# Patient Record
Sex: Male | Born: 1960 | Race: White | Hispanic: No | Marital: Single | Smoking: Never smoker
Health system: Southern US, Community
[De-identification: ages and names within clinical notes are randomized; demographics above are authoritative.]

## PROBLEM LIST (undated history)

## (undated) DIAGNOSIS — K5732 Diverticulitis of large intestine without perforation or abscess without bleeding: Secondary | ICD-10-CM

## (undated) DIAGNOSIS — F329 Major depressive disorder, single episode, unspecified: Secondary | ICD-10-CM

## (undated) DIAGNOSIS — E119 Type 2 diabetes mellitus without complications: Secondary | ICD-10-CM

## (undated) DIAGNOSIS — Z6841 Body Mass Index (BMI) 40.0 and over, adult: Secondary | ICD-10-CM

## (undated) DIAGNOSIS — J449 Chronic obstructive pulmonary disease, unspecified: Secondary | ICD-10-CM

## (undated) DIAGNOSIS — F32A Depression, unspecified: Secondary | ICD-10-CM

## (undated) DIAGNOSIS — Z5189 Encounter for other specified aftercare: Secondary | ICD-10-CM

## (undated) DIAGNOSIS — I639 Cerebral infarction, unspecified: Secondary | ICD-10-CM

## (undated) DIAGNOSIS — R569 Unspecified convulsions: Secondary | ICD-10-CM

## (undated) DIAGNOSIS — H269 Unspecified cataract: Secondary | ICD-10-CM

## (undated) DIAGNOSIS — I10 Essential (primary) hypertension: Secondary | ICD-10-CM

## (undated) DIAGNOSIS — K5190 Ulcerative colitis, unspecified, without complications: Secondary | ICD-10-CM

## (undated) DIAGNOSIS — C187 Malignant neoplasm of sigmoid colon: Secondary | ICD-10-CM

## (undated) DIAGNOSIS — F419 Anxiety disorder, unspecified: Secondary | ICD-10-CM

## (undated) DIAGNOSIS — L0293 Carbuncle, unspecified: Secondary | ICD-10-CM

## (undated) DIAGNOSIS — L0292 Furuncle, unspecified: Secondary | ICD-10-CM

## (undated) DIAGNOSIS — C787 Secondary malignant neoplasm of liver and intrahepatic bile duct: Secondary | ICD-10-CM

## (undated) DIAGNOSIS — I2510 Atherosclerotic heart disease of native coronary artery without angina pectoris: Secondary | ICD-10-CM

## (undated) DIAGNOSIS — K7581 Nonalcoholic steatohepatitis (NASH): Secondary | ICD-10-CM

## (undated) HISTORY — PX: CARDIAC CATHETERIZATION: SHX172

## (undated) HISTORY — DX: Anxiety disorder, unspecified: F41.9

## (undated) HISTORY — DX: Encounter for other specified aftercare: Z51.89

## (undated) HISTORY — DX: Carbuncle, unspecified: L02.93

## (undated) HISTORY — DX: Type 2 diabetes mellitus without complications: E11.9

## (undated) HISTORY — PX: ABDOMINAL SURGERY: SHX537

## (undated) HISTORY — DX: Furuncle, unspecified: L02.92

## (undated) HISTORY — DX: Body Mass Index (BMI) 40.0 and over, adult: Z684

## (undated) HISTORY — DX: Major depressive disorder, single episode, unspecified: F32.9

## (undated) HISTORY — PX: COLONOSCOPY: SHX174

## (undated) HISTORY — DX: Secondary malignant neoplasm of liver and intrahepatic bile duct: C78.7

## (undated) HISTORY — DX: Cerebral infarction, unspecified: I63.9

## (undated) HISTORY — DX: Chronic obstructive pulmonary disease, unspecified: J44.9

## (undated) HISTORY — DX: Unspecified cataract: H26.9

## (undated) HISTORY — DX: Malignant neoplasm of sigmoid colon: C18.7

## (undated) HISTORY — DX: Ulcerative colitis, unspecified, without complications: K51.90

## (undated) HISTORY — PX: POLYPECTOMY: SHX149

## (undated) HISTORY — PX: OTHER SURGICAL HISTORY: SHX169

## (undated) HISTORY — DX: Atherosclerotic heart disease of native coronary artery without angina pectoris: I25.10

## (undated) HISTORY — PX: TONSILLECTOMY: SUR1361

## (undated) HISTORY — DX: Unspecified convulsions: R56.9

## (undated) HISTORY — DX: Diverticulitis of large intestine without perforation or abscess without bleeding: K57.32

## (undated) HISTORY — DX: Essential (primary) hypertension: I10

## (undated) HISTORY — DX: Morbid (severe) obesity due to excess calories: E66.01

## (undated) HISTORY — DX: Nonalcoholic steatohepatitis (NASH): K75.81

## (undated) HISTORY — DX: Depression, unspecified: F32.A

---

## 2011-03-24 DIAGNOSIS — C187 Malignant neoplasm of sigmoid colon: Secondary | ICD-10-CM

## 2011-03-24 HISTORY — DX: Malignant neoplasm of sigmoid colon: C18.7

## 2011-04-23 HISTORY — PX: COLON SURGERY: SHX602

## 2011-07-23 ENCOUNTER — Emergency Department (HOSPITAL_COMMUNITY)
Admission: EM | Admit: 2011-07-23 | Discharge: 2011-07-24 | Disposition: A | Discharge status: Home / Self Care | Payer: Self-pay | Attending: Emergency Medicine | Admitting: Emergency Medicine

## 2011-07-23 ENCOUNTER — Emergency Department (HOSPITAL_COMMUNITY): Payer: Self-pay

## 2011-07-23 ENCOUNTER — Encounter (HOSPITAL_COMMUNITY): Payer: Self-pay | Admitting: *Deleted

## 2011-07-23 DIAGNOSIS — Z79899 Other long term (current) drug therapy: Secondary | ICD-10-CM | POA: Insufficient documentation

## 2011-07-23 DIAGNOSIS — R110 Nausea: Secondary | ICD-10-CM | POA: Insufficient documentation

## 2011-07-23 DIAGNOSIS — R42 Dizziness and giddiness: Secondary | ICD-10-CM | POA: Insufficient documentation

## 2011-07-23 DIAGNOSIS — R1033 Periumbilical pain: Secondary | ICD-10-CM | POA: Insufficient documentation

## 2011-07-23 DIAGNOSIS — C189 Malignant neoplasm of colon, unspecified: Secondary | ICD-10-CM | POA: Insufficient documentation

## 2011-07-23 DIAGNOSIS — R51 Headache: Secondary | ICD-10-CM | POA: Insufficient documentation

## 2011-07-23 DIAGNOSIS — E119 Type 2 diabetes mellitus without complications: Secondary | ICD-10-CM | POA: Insufficient documentation

## 2011-07-23 LAB — BASIC METABOLIC PANEL
GFR calc Af Amer: 82 mL/min — ABNORMAL LOW (ref >90)
GFR calc non Af Amer: 71 mL/min — ABNORMAL LOW (ref >90)
Glucose, Bld: 161 mg/dL — ABNORMAL HIGH (ref 70–99)
Potassium: 4.4 mEq/L (ref 3.5–5.1)
Sodium: 136 mEq/L (ref 135–145)

## 2011-07-23 LAB — URINALYSIS, ROUTINE W REFLEX MICROSCOPIC
Hgb urine dipstick: NEGATIVE
Nitrite: NEGATIVE
Specific Gravity, Urine: 1.023 (ref 1.005–1.030)
Urobilinogen, UA: 0.2 mg/dL (ref 0.0–1.0)

## 2011-07-23 LAB — CBC
Hemoglobin: 12.2 g/dL — ABNORMAL LOW (ref 13.0–17.0)
MCHC: 33 g/dL (ref 30.0–36.0)

## 2011-07-23 NOTE — ED Provider Notes (Signed)
History     CSN: [id number]  Arrival date & time 07/23/11  1713   First MD Initiated Contact with Patient 07/23/11 1929      Chief Complaint  Patient presents with  . Post-op Problem    (Consider location/radiation/quality/duration/timing/severity/associated sxs/prior treatment) Patient is a 51 y.o. male presenting with abdominal pain. The history is provided by the patient.  Abdominal Pain The primary symptoms of the illness include abdominal pain and nausea. The primary symptoms of the illness do not include shortness of breath, vomiting or diarrhea. Episode onset: 1.5 months ago. The onset of the illness was gradual. The problem has been gradually worsening.  Pain Location: around the umbilicus and describes it as a burning pain. The abdominal pain does not radiate. The severity of the abdominal pain is 6/10. The abdominal pain is relieved by nothing. Exacerbated by: nothing.  The patient has had a change in bowel habit (watery stools for the last 1 week). Symptoms associated with the illness do not include chills, anorexia, diaphoresis, urgency, frequency or back pain. Associated medical issues comments: recent abdominal surgery.    Past Medical History  Diagnosis Date  . Cancer   . Diabetes mellitus     Past Surgical History  Procedure Date  . Abdominal surgery   . Colon surgery     No family history on file.  History  Substance Use Topics  . Smoking status: Not on file  . Smokeless tobacco: Not on file  . Alcohol Use: No      Review of Systems  Constitutional: Negative for chills and diaphoresis.  Respiratory: Negative for shortness of breath.   Gastrointestinal: Positive for nausea and abdominal pain. Negative for vomiting, diarrhea and anorexia.  Genitourinary: Negative for urgency and frequency.  Musculoskeletal: Negative for back pain.  Neurological: Positive for dizziness. Negative for syncope and speech difficulty.       Patient states for the last 2  months he has had dizziness that he describes as spinning. It was worse when he moves his head or is walking.  He states intermittently he also will get tingling in his left arm and leg it is not associated with headaches or abdominal pain.  All other systems reviewed and are negative.    Allergies  Review of patient's allergies indicates no known allergies.  Home Medications   Current Outpatient Rx  Name Route Sig Dispense Refill  . CETIRIZINE HCL 10 MG PO TABS Oral Take 10 mg by mouth daily.    . DESVENLAFAXINE SUCCINATE ER 50 MG PO TB24 Oral Take 50 mg by mouth daily.    Marland Kitchen DICLOFENAC SODIUM 50 MG PO TBEC Oral Take 50 mg by mouth 2 (two) times daily as needed. For pain    . DIPHENHYDRAMINE HCL (SLEEP) 25 MG PO TABS Oral Take 25 mg by mouth at bedtime as needed. For sleep or itching    . GLIPIZIDE 10 MG PO TABS Oral Take 10 mg by mouth 2 (two) times daily before a meal.    . IBUPROFEN 200 MG PO TABS Oral Take 600 mg by mouth every 6 (six) hours as needed. For pain    . MECLIZINE HCL 25 MG PO TABS Oral Take 25 mg by mouth 2 (two) times daily as needed. For dizziness    . METFORMIN HCL 500 MG PO TABS Oral Take 500 mg by mouth 2 (two) times daily with a meal. 500 mg in the am and 1000mg  at bedtime    . ZOFRAN  PO Oral Take 50 mg by mouth 2 (two) times daily as needed. For nausea    . TRAZODONE HCL 100 MG PO TABS Oral Take 200 mg by mouth at bedtime.      BP 141/74  Pulse 82  Temp(Src) 97.7 F (36.5 C) (Oral)  Resp 20  SpO2 100%  Physical Exam  Nursing note and vitals reviewed. Constitutional: He is oriented to person, place, and time. He appears well-developed and well-nourished. No distress.  HENT:  Head: Normocephalic and atraumatic.  Mouth/Throat: Oropharynx is clear and moist.  Eyes: Conjunctivae and EOM are normal. Pupils are equal, round, and reactive to light.  Neck: Normal range of motion. Neck supple.  Cardiovascular: Normal rate, regular rhythm and intact distal pulses.    No murmur heard. Pulmonary/Chest: Effort normal and breath sounds normal. No respiratory distress. He has no wheezes. He has no rales.  Abdominal: Soft. Normal appearance and bowel sounds are normal. He exhibits no distension. There is generalized tenderness. There is no rebound and no guarding. No hernia.       Healed midline abdominal scar. Mild tenderness diffusely in the abdomen  Musculoskeletal: Normal range of motion. He exhibits no edema and no tenderness.  Neurological: He is alert and oriented to person, place, and time. He has normal strength. No cranial nerve deficit or sensory deficit. Coordination normal.  Skin: Skin is warm and dry. No rash noted. No erythema.  Psychiatric: He has a normal mood and affect. His behavior is normal.    ED Course  Procedures (including critical care time)  Labs Reviewed  CBC - Abnormal; Notable for the following:    Hemoglobin 12.2 (*)    HCT 37.0 (*)    All other components within normal limits  BASIC METABOLIC PANEL - Abnormal; Notable for the following:    Glucose, Bld 161 (*)    BUN 26 (*)    GFR calc non Af Amer 71 (*)    GFR calc Af Amer 82 (*)    All other components within normal limits  URINALYSIS, ROUTINE W REFLEX MICROSCOPIC   Dg Abd 1 View  07/23/2011  *RADIOLOGY REPORT*  Clinical Data: Abdominal pain.  ABDOMEN - 1 VIEW  Comparison: CT scan 07/15/2011.  Findings: The bowel gas pattern is unremarkable.  No findings for small bowel obstruction.  The soft tissue shadows of the abdomen maintained.  The lung bases are not included but no obvious free air.  The bony structures are intact.  Moderate degenerative changes involving both hips.  IMPRESSION:  No plain film findings for an acute abdominal process.  Original Report Authenticated By: P. Loralie Champagne, M.D.   Ct Head Wo Contrast  07/23/2011  *RADIOLOGY REPORT*  Clinical Data: Headache, dizziness, history of carcinoma of the colon  CT HEAD WITHOUT CONTRAST  Technique:  Contiguous  axial images were obtained from the base of the skull through the vertex without contrast.  Comparison: 11/11/2010 Correlation:  MRI brain 07/01/2011  Findings: Extensive falcine dural calcification again identified. Generalized atrophy. Normal ventricular morphology. No midline shift or mass effect. No definite intracranial hemorrhage, mass lesion, or evidence of acute infarction. No definite extra-axial fluid collections. 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 Visualized bones and sinuses unremarkable.  IMPRESSION: No acute intracranial abnormalities.  Original Report Authenticated By: Lollie Marrow, M.D.     No diagnosis found.    MDM   Patient here with 2  separate issues. His initial concern was for persistent abdominal pain after colon surgery on 04/23/2011 colon cancer. He had a primary anastomosis of the sigmoid colon and states over last month and a half he's had worsening burning pain around his umbilicus change in his stool this last one week. He had a CT scan done on 07/15/2011 which showed adjacent area of infiltration, scarring and focal gas collection next to the surgical anastomosis. Per the radiologist this is slightly improved from previous study done in March. They state the differential diagnosis includes an anastomotic leak, postoperative changes or infection. Patient denies any fever, vomiting but has been nauseated. CBC here shows no normal white blood cell count and BMP is within normal limits. Will speak with general surgery to review the films and give further recommendations.  Secondly patient states he's been dizzy for 2 months. The dizziness he describes as more of vertigo type feeling that has been constant for 2 months but is worse when he gets up and walks around or moves his head too quickly in one direction. He also states that he gets intermittent left-sided tingling in his arm and leg but  denies ever having any weakness or numbness. Patient is diabetic but is controlled on oral glipizide and metformin. He is already taking Antivert for his dizziness.  She is no prior stroke history and he has a normal neuro exam here. He has no difficulty with his gait. Head CT pending however patient had a brain MR done on 07/01/2011 because of these exact same symptoms and it was negative for any infarct and showed a persistent meningioma which is unchanged.  Given these findings most likely the patient has peripheral vertigo.  9:53 PM  Spoke with Dr. Michaell Cowing about pt and CT findings.  Because the scan is one week old and patient is having persistent abdominal pain and watery stools now he recommended doing a repeat CT scan. However due to patient having normal labs, normal vital signs and in no extremities.. The CAT scan is unchanged it would be most reasonable for patient to followup in the clinic when all of the details of his surgery and prior hospitalizations are available.   Pt checked out to Dr. Fonnie Jarvis at [address], MD 07/25/11 [phone]

## 2011-07-23 NOTE — ED Notes (Signed)
Pt had a colon surgery 04/23/11 and has been having generalized weakness and intermittent dizziness.   He had a follow up CT which showed a "leak"  Pt spoke with surgeon who was not concerned, pt however is and is here for evaluation.

## 2011-07-23 NOTE — ED Notes (Signed)
CT notified that pt has finished drinking.  Will scan at 23:30.

## 2011-07-23 NOTE — ED Notes (Signed)
Pt received po contrast.  One to drink at 9:30, 2nd at 10:30.

## 2011-07-23 NOTE — ED Notes (Signed)
Pt had colon resection in January 2013.  Had f/u CT this week and MD called to tell him there was a small leakage but it was not a problem.  Wants to be seen tonight about dizziness and numbness/tingling in left arm and left leg for 2-3 months.  Pt states episodes of dizziness, no recent falls but states he felt dizzy tonight and had to brace himself against the wall.  Ambulatory to exam rooms with no apparent problems.  Alert and oriented x 4, MAE

## 2011-07-23 NOTE — ED Notes (Signed)
Pt returned from xray

## 2011-07-23 NOTE — ED Notes (Signed)
Pt ambulatory from triage to exam room.  

## 2011-07-24 LAB — GLUCOSE, CAPILLARY: Glucose-Capillary: 108 mg/dL — ABNORMAL HIGH (ref 70–99)

## 2011-07-24 MED ORDER — IOHEXOL 300 MG/ML  SOLN
100.0000 mL | Freq: Once | INTRAMUSCULAR | Status: AC | PRN
  Administered 2011-07-24: 100 mL via INTRAVENOUS

## 2011-07-24 NOTE — ED Provider Notes (Signed)
Patient ambulatory in the emergency Department, CT scan results explained to the patient which appears stable if not improved, the plan is for the patient to be discharged to have his cancer doctor call [facility] to arrange urgent followup as an outpatient and the patient agrees with this plan. This case was previously discussed with [facility] earlier in today's ED course.  Andrew Horn, MD 07/24/11 [phone]

## 2011-07-24 NOTE — Discharge Instructions (Signed)
Follow clear liquid diet until recheck by Surgery.  Abdominal (belly) pain can be caused by many things. Your caregiver performed an examination and possibly ordered blood/urine tests and imaging (CT scan, x-rays, ultrasound). Many cases can be observed and treated at home after initial evaluation in the emergency department. Even though you are being discharged home, abdominal pain can be unpredictable. Therefore, you need a repeated exam if your pain does not resolve, returns, or worsens. Most patients with abdominal pain don't have to be admitted to the hospital or have surgery, but serious problems like appendicitis and gallbladder attacks can start out as nonspecific pain. Many abdominal conditions cannot be diagnosed in one visit, so follow-up evaluations are very important. SEEK IMMEDIATE MEDICAL ATTENTION IF: The pain does not go away or becomes severe.  A temperature above 101 develops.  Repeated vomiting occurs (multiple episodes).  The pain becomes localized to portions of the abdomen. The right side could possibly be appendicitis. In an adult, the left lower portion of the abdomen could be colitis or diverticulitis.  Blood is being passed in stools or vomit (bright red or black tarry stools).  Return also if you develop chest pain, difficulty breathing, dizziness or fainting, or become confused, poorly responsive, or inconsolable (young children).

## 2011-07-24 NOTE — ED Notes (Signed)
No rx given, pt voiced understanding to f/u with [facility] MD who will contact [facility] for second opinion.

## 2011-07-27 ENCOUNTER — Telehealth (INDEPENDENT_AMBULATORY_CARE_PROVIDER_SITE_OTHER): Payer: Self-pay | Admitting: General Surgery

## 2011-07-27 NOTE — Telephone Encounter (Signed)
Pt calling to see if he can be seen sooner than 08/03/11, as scheduled.  (Pt states he is coming in for a 2nd opinion.)  He states he is bleeding and having watery diarrhea over week-end.  Advised pt to call his oncologist for advice on this issue.  There are no "sooner" appointments available, and he has not been seen here yet.

## 2011-08-03 ENCOUNTER — Encounter (INDEPENDENT_AMBULATORY_CARE_PROVIDER_SITE_OTHER): Payer: Self-pay | Admitting: Surgery

## 2011-08-03 ENCOUNTER — Ambulatory Visit (INDEPENDENT_AMBULATORY_CARE_PROVIDER_SITE_OTHER): Payer: Self-pay | Admitting: Surgery

## 2011-08-03 VITALS — BP 140/75 | HR 103 | Temp 97.7°F | Ht 74.0 in | Wt 318.6 lb

## 2011-08-03 DIAGNOSIS — F329 Major depressive disorder, single episode, unspecified: Secondary | ICD-10-CM | POA: Insufficient documentation

## 2011-08-03 DIAGNOSIS — E1140 Type 2 diabetes mellitus with diabetic neuropathy, unspecified: Secondary | ICD-10-CM | POA: Insufficient documentation

## 2011-08-03 DIAGNOSIS — C187 Malignant neoplasm of sigmoid colon: Secondary | ICD-10-CM

## 2011-08-03 DIAGNOSIS — T8140XA Infection following a procedure, unspecified, initial encounter: Secondary | ICD-10-CM

## 2011-08-03 DIAGNOSIS — R42 Dizziness and giddiness: Secondary | ICD-10-CM | POA: Insufficient documentation

## 2011-08-03 DIAGNOSIS — K651 Peritoneal abscess: Secondary | ICD-10-CM

## 2011-08-03 DIAGNOSIS — Z6841 Body Mass Index (BMI) 40.0 and over, adult: Secondary | ICD-10-CM

## 2011-08-03 DIAGNOSIS — E119 Type 2 diabetes mellitus without complications: Secondary | ICD-10-CM

## 2011-08-03 DIAGNOSIS — K7581 Nonalcoholic steatohepatitis (NASH): Secondary | ICD-10-CM | POA: Insufficient documentation

## 2011-08-03 DIAGNOSIS — R159 Full incontinence of feces: Secondary | ICD-10-CM

## 2011-08-03 HISTORY — DX: Morbid (severe) obesity due to excess calories: E66.01

## 2011-08-03 HISTORY — DX: Type 2 diabetes mellitus without complications: E11.9

## 2011-08-03 MED ORDER — AMOXICILLIN-POT CLAVULANATE 875-125 MG PO TABS: 1.0000 | ORAL_TABLET | Freq: Two times a day (BID) | ORAL | Status: AC

## 2011-08-03 NOTE — Progress Notes (Signed)
Subjective:     Patient ID: Andrew Walls, male   DOB: Sep 17, 1960, 51 y.o.   MRN: [id number]  HPI  Andrew Walls  1960/07/13 [id number]  Patient Care Team: Ignatius Specking, MD as PCP - General (Internal Medicine) Erskine Speed as Consulting Physician (General Surgery) Michae Kava, MD as Consulting Physician (Hematology and Oncology)  This patient is a 51 y.o.male who presents today for surgical evaluation at the request of ?Dr. Ubaldo Glassing.   Reason for visit: Abdominal pain and fatigue several months out from colectomy for Stage II sigmoid colon cancer.  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 . 0/18 lymph nodes equals =N0.  One margin 1.5 cm. Another margin 5.5 cm.  Anastomotic rings not mentioned but performed in surgery.  Patient is a morbidly obese a poorly controlled diabetic male. He's had repeated episodes of colon inflammation presumed to be diverticulitis since 2006. He was finally convinced to undergo colonoscopy by his primary care physician. Dr. Gabriel Cirri noted a cancer at the rectosigmoid junction. Patient underwent an open sigmoid colectomy within a few days. Staged as a T3 N0. Patient was being considered for post adjuvant chemotherapy. However has issues of probable neuropathy and so had not been started.  He had been open wound at his bellybutton. It was followed by the wound center in [place] and eventually closed. He has had issues with abdominal pain. He had CAT scans done postoperatively. Stranding and gas was seen near the anastomosis, suspicious for leak versus abscess on a CT scan in March. The patient does not recall getting antibiotics nor  being readmitted. Records are incomplete. I am trying to get them all together.  Apparently, he was having worsening symptoms of fatigue and abdominal pain. His oncologist recommended the patient go to the [facility] health ER  for evaluation. They called me in the middle the night.  The patient was not toxic. No fever. Normal white count. Minimal abdominal discomfort. I recommended considering a CAT scan just for comparison to the abnormal one a month ago and followup with his Pensions consultant.  The patient now shows up in our office noting someone told him to come here.  The patient notes he has some burning pain around his bellybutton and below it. An episode of blood in the bowel movement this weekend. Has had one or 2 episodes were he has not had some sensation and has had fecal accidents. No history of prior incontinence. Appetite okay. Occasionally constipated occasionally loose. Feels tired.  Had an episode of lightheadedness and left-sided weakness. Had a workup including CAT scan MRI that was all negative.    Patient Active Problem List  Diagnoses  . Diabetes mellitus  . Cancer of sigmoid colon, s/p colectomy Jan2013, cT4pN0 (5.5cm, 0/18 LN), K-ras (-)  . Morbid obesity with BMI of 40.0-44.9, adult  . Dizziness - light-headed  . Diabetic neuropathy, type II diabetes mellitus  . Depression  . Postoperative intra-abdominal abscess  . Fecal incontinence    Past Medical History  Diagnosis Date  . Diabetes mellitus 08/03/2011  . Cancer of sigmoid colon, s/p colectomy [id number], pT3pN0 (0/8 LN) 08/03/2011  . Morbid obesity with BMI of 40.0-44.9, adult 08/03/2011    Past Surgical History  Procedure Date  . Abdominal surgery   . Colon surgery 04/23/2011    left colectomy    History   Social History  . Marital Status: Single    Spouse  Name: N/A    Number of Children: N/A  . Years of Education: N/A   Occupational History  . Not on file.   Social History Main Topics  . Smoking status: Never Smoker   . Smokeless tobacco: Not on file  . Alcohol Use: No  . Drug Use:   . Sexually Active:    Other Topics Concern  . Not on file   Social History Narrative  . No narrative on file    Family  History  Problem Relation Age of Onset  . Heart disease Mother   . Cancer Father     melanoma    Current Outpatient Prescriptions  Medication Sig Dispense Refill  . cetirizine (ZYRTEC) 10 MG tablet Take 10 mg by mouth daily.      Marland Kitchen desvenlafaxine (PRISTIQ) 50 MG 24 hr tablet Take 50 mg by mouth daily.      . diclofenac (VOLTAREN) 50 MG EC tablet Take 50 mg by mouth 2 (two) times daily as needed. For pain      . diphenhydrAMINE (SOMINEX) 25 MG tablet Take 25 mg by mouth at bedtime as needed. For sleep or itching      . glipiZIDE (GLUCOTROL) 10 MG tablet Take 10 mg by mouth 2 (two) times daily before a meal.      . ibuprofen (ADVIL,MOTRIN) 200 MG tablet Take 600 mg by mouth every 6 (six) hours as needed. For pain      . meclizine (ANTIVERT) 25 MG tablet Take 25 mg by mouth 2 (two) times daily as needed. For dizziness      . metFORMIN (GLUCOPHAGE) 500 MG tablet Take 500 mg by mouth 2 (two) times daily with a meal. 500 mg in the am and 1000mg  at bedtime      . Ondansetron HCl (ZOFRAN PO) Take 50 mg by mouth 2 (two) times daily as needed. For nausea      . traZODone (DESYREL) 100 MG tablet Take 200 mg by mouth at bedtime.      Marland Kitchen amoxicillin-clavulanate (AUGMENTIN) 875-125 MG per tablet Take 1 tablet by mouth 2 (two) times daily.  28 tablet  3     No Known Allergies  BP 140/75  Pulse 103  Temp(Src) 97.7 F (36.5 C) (Temporal)  Ht 6\' 2"  (1.88 m)  Wt 318 lb 9.6 oz (144.516 kg)  BMI 40.91 kg/m2  SpO2 98%  Dg Abd 1 View  07/23/2011  *RADIOLOGY REPORT*  Clinical Data: Abdominal pain.  ABDOMEN - 1 VIEW  Comparison: CT scan 07/15/2011.  Findings: The bowel gas pattern is unremarkable.  No findings for small bowel obstruction.  The soft tissue shadows of the abdomen maintained.  The lung bases are not included but no obvious free air.  The bony structures are intact.  Moderate degenerative changes involving both hips.  IMPRESSION:  No plain film findings for an acute abdominal process.  Original  Report Authenticated By: P. Loralie Champagne, M.D.   Ct Head Wo Contrast  07/23/2011  *RADIOLOGY REPORT*  Clinical Data: Headache, dizziness, history of carcinoma of the colon  CT HEAD WITHOUT CONTRAST  Technique:  Contiguous axial images were obtained from the base of the skull through the vertex without contrast.  Comparison: 11/11/2010 Correlation:  MRI brain 07/01/2011  Findings: Extensive falcine dural calcification again identified. Generalized atrophy. Normal ventricular morphology. No midline shift or mass effect. No definite intracranial hemorrhage, mass lesion, or evidence of acute infarction. No definite extra-axial fluid collections. 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 Visualized bones and sinuses unremarkable.  IMPRESSION: No acute intracranial abnormalities.  Original Report Authenticated By: Lollie Marrow, M.D.   Ct Abdomen Pelvis W Contrast  07/24/2011  *RADIOLOGY REPORT*  Clinical Data: Generalized weakness, recent colon surgery, questionable leak.  CT ABDOMEN AND PELVIS WITH CONTRAST  Technique:  Multidetector CT imaging of the abdomen and pelvis was performed following the standard protocol during bolus administration of intravenous contrast.  Contrast: OMNIPAQUE IOHEXOL 300 MG/ML  SOLN  Comparison: 07/15/2011 CT performed at [facility]  Findings: Limited images through the lung bases demonstrate no significant appreciable abnormality. The heart size is within normal limits. No pleural or pericardial effusion.  Hepatic steatosis.  Variant enhancement of the posterior segment right hepatic lobe versus area of fatty sparing.  Unremarkable spleen, pancreas, biliary system, adrenal glands.  Symmetric renal enhancement.  No hydronephrosis or hydroureter.  Status post partial colectomy with anastomotic suture noted in the region of the sigmoid colon.  Again noted is adjacent fat stranding and associated  tract that extends into the adjacent fat however without definite communication with adjacent bowel.  Decreased extraluminal gas in the interval.  No bowel obstruction.  Decompressed bladder.  No lymphadenopathy.  There is scattered atherosclerotic calcification of the aorta and its branches. No aneurysmal dilatation.  Multilevel degenerative changes of the imaged spine. No acute or aggressive appearing osseous lesion.  IMPRESSION: Surgical anastomosis at the sigmoid colon with adjacent inflammatory change/tract is again noted.  No fluid collection or fistulous communication to the nearby small bowel identified. This may represent sequelae of a small anastomotic leak that has walled off as there has been interval near resolution of the previously noted extraluminal gas. A follow-up CT after administration of rectal contrast may provide more information to exclude leak if clinically warranted.  Hepatic steatosis.  Original Report Authenticated By: Waneta Martins, M.D.     Review of Systems  Constitutional: Positive for chills. Negative for fever and diaphoresis.  HENT: Positive for hearing loss. Negative for nosebleeds, sore throat, facial swelling, mouth sores and ear discharge.   Eyes: Positive for visual disturbance. Negative for photophobia and discharge.  Respiratory: Positive for shortness of breath. Negative for cough, choking, chest tightness, wheezing and stridor.   Cardiovascular: Positive for chest pain and palpitations. Negative for leg swelling.       Patient walks 10 minutes for about .25  miles without difficulty before getting SOB.  No exertional chest/neck/shoulder/arm pain.  Gastrointestinal: Positive for nausea, vomiting, abdominal pain and blood in stool. Negative for diarrhea, constipation, abdominal distention, anal bleeding and rectal pain.  Genitourinary: Negative for dysuria, urgency, difficulty urinating and testicular pain.  Musculoskeletal: Negative for myalgias, back  pain, arthralgias and gait problem.  Skin: Negative for color change, pallor, rash and wound.  Neurological: Positive for weakness, light-headedness and headaches. Negative for dizziness, speech difficulty and numbness.  Hematological: Negative for adenopathy. Does not bruise/bleed easily.  Psychiatric/Behavioral: Negative for hallucinations, confusion and agitation.       Objective:   Physical Exam  Constitutional: He is oriented to person, place, and time. He appears well-developed and well-nourished. No distress.  HENT:  Head: Normocephalic.  Mouth/Throat: Oropharynx is clear and moist. No oropharyngeal exudate.  Eyes: Conjunctivae and EOM are normal. Pupils are equal, round, and reactive to light. No scleral icterus.  Neck: Normal range of motion. Neck supple. No tracheal deviation present.  Cardiovascular: Normal rate,  regular rhythm and intact distal pulses.   Pulmonary/Chest: Effort normal and breath sounds normal. No respiratory distress.  Abdominal: Soft. He exhibits no distension. There is no tenderness. There is negative Murphy's sign. Hernia confirmed negative in the right inguinal area and confirmed negative in the left inguinal area.    Musculoskeletal: Normal range of motion. He exhibits no tenderness.  Lymphadenopathy:    He has no cervical adenopathy.       Right: No inguinal adenopathy present.       Left: No inguinal adenopathy present.  Neurological: He is alert and oriented to person, place, and time. No cranial nerve deficit. He exhibits normal muscle tone. Coordination normal.  Skin: Skin is warm and dry. No rash noted. He is not diaphoretic. No erythema. No pallor.  Psychiatric: He has a normal mood and affect. His behavior is normal. Judgment and thought content normal.   Dg Abd 1 View  07/23/2011  *RADIOLOGY REPORT*  Clinical Data: Abdominal pain.  ABDOMEN - 1 VIEW  Comparison: CT scan 07/15/2011.  Findings: The bowel gas pattern is unremarkable.  No findings  for small bowel obstruction.  The soft tissue shadows of the abdomen maintained.  The lung bases are not included but no obvious free air.  The bony structures are intact.  Moderate degenerative changes involving both hips.  IMPRESSION:  No plain film findings for an acute abdominal process.  Original Report Authenticated By: P. Loralie Champagne, M.D.   Ct Head Wo Contrast  07/23/2011  *RADIOLOGY REPORT*  Clinical Data: Headache, dizziness, history of carcinoma of the colon  CT HEAD WITHOUT CONTRAST  Technique:  Contiguous axial images were obtained from the base of the skull through the vertex without contrast.  Comparison: 11/11/2010 Correlation:  MRI brain 07/01/2011  Findings: Extensive falcine dural calcification again identified. Generalized atrophy. Normal ventricular morphology. No midline shift or mass effect. No definite intracranial hemorrhage, mass lesion, or evidence of acute infarction. No definite extra-axial fluid collections. 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 Visualized bones and sinuses unremarkable.  IMPRESSION: No acute intracranial abnormalities.  Original Report Authenticated By: Lollie Marrow, M.D.   Ct Abdomen Pelvis W Contrast  07/24/2011  *RADIOLOGY REPORT*  Clinical Data: Generalized weakness, recent colon surgery, questionable leak.  CT ABDOMEN AND PELVIS WITH CONTRAST  Technique:  Multidetector CT imaging of the abdomen and pelvis was performed following the standard protocol during bolus administration of intravenous contrast.  Contrast: OMNIPAQUE IOHEXOL 300 MG/ML  SOLN  Comparison: 07/15/2011 CT performed at [facility]  Findings: Limited images through the lung bases demonstrate no significant appreciable abnormality. The heart size is within normal limits. No pleural or pericardial effusion.  Hepatic steatosis.  Variant enhancement of the posterior segment right hepatic lobe versus  area of fatty sparing.  Unremarkable spleen, pancreas, biliary system, adrenal glands.  Symmetric renal enhancement.  No hydronephrosis or hydroureter.  Status post partial colectomy with anastomotic suture noted in the region of the sigmoid colon.  Again noted is adjacent fat stranding and associated tract that extends into the adjacent fat however without definite communication with adjacent bowel.  Decreased extraluminal gas in the interval.  No bowel obstruction.  Decompressed bladder.  No lymphadenopathy.  There is scattered atherosclerotic calcification of the aorta and its branches. No aneurysmal dilatation.  Multilevel degenerative changes of the imaged spine. No acute or aggressive appearing osseous lesion.  IMPRESSION: Surgical anastomosis at the  sigmoid colon with adjacent inflammatory change/tract is again noted.  No fluid collection or fistulous communication to the nearby small bowel identified. This may represent sequelae of a small anastomotic leak that has walled off as there has been interval near resolution of the previously noted extraluminal gas. A follow-up CT after administration of rectal contrast may provide more information to exclude leak if clinically warranted.  Hepatic steatosis.  Original Report Authenticated By: Waneta Martins, M.D.     Assessment:     Over 3 months status post colectomy for stage II colon cancer, node negative. Probable T4 given history of prior perforations and no diverticulitis noted on the pathology report.  Postoperative pain and symptoms. Improving CAT scan. Possible persistent cellulitis/abscess near the anastomosis.  Poorly controlled diabetic. Numbers a little better in the ER.    Plan:     I spent over an hour reviewing films records and examining the patient. I discussed with to my partners ([facility] and [place]).  At this point, I think he had an abscess and small leak. It is improving by radiography. He still has symptoms, so I  think it is reasonable to do a trial of antibiotics (Augmentin x 2 weeks) to help encourage the area to more fully healed.  If things worsen set clinically, CT scan with rectal contrast to rule out a true anastomotic leak.  However, I see no reason for fecal diversion or percutaneous drainage since it seems to be getting better.  Patient needs a bowel regimen to keep himself more regular. I gave him handouts on this.  Patient requires a followup colonoscopy a year out from surgery. January 2014. Defer to Dr. Gabriel Cirri on this.  Consider repeat CEA since 3 months out and had it elevated preoperatively (13.5)  I asked the patient to try and call his oncologist. I will try and call the team up in [facility] as well to sort out what is desired. I am quite willing to help follow this patient. I would need to get more of the records if the patient wishes me to stay involved.Otherwise, the patient may wish to stay in [place], [place] where it is more convenient for him.

## 2011-08-03 NOTE — Patient Instructions (Addendum)
Colorectal Cancer The colon is the large bowel and is the storage part of the bowel for undigested food. The large bowel is also called the intestine or gut. Cancer is a growth that is not supposed to be there.  RISK FACTORS Risks for cancer of the colon include:   Age. More than 90 percent of people with this disease are diagnosed after age 53.   Polyps. Growths on the inner wall of the colon or rectum may become cancerous.   Family history. Some cancers of the colon are inherited or run in families. These include:   Hereditary nonpolyposis colon cancer (HNPCC) is the most common type of inherited (genetic) colorectal cancer. It accounts for about 2 percent of all colorectal cancer cases. It is caused by genetic changes. About 75% of people with an altered HNPCC gene develop colon cancer, and the average age at diagnosis of colon cancer is 44.   Familial adenomatous polyposis (FAP) is a rare inherited condition in which hundreds of polyps form in the colon and rectum. It is caused by a change in a specific gene called APC. Unless FAP is treated, it usually leads to colorectal cancer by age 40. FAP accounts for less than 1 percent of all colorectal cancer cases.   Family members of people who have HNPCC or FAP can have genetic testing to check for specific genetic changes. For those who have changes in their genes, health care providers may suggest ways to try to reduce the risk of colorectal cancer or to improve the detection of this disease. For adults with FAP, the doctor may recommend an operation to remove all or part of the colon and rectum.   Personal history of colorectal cancer. A person who has already had colorectal cancer may develop colorectal cancer a second time. Also, women with a history of cancer of the ovary, uterus (endometrium), or breast are at a somewhat higher risk of developing colorectal cancer.   Ulcerative colitis or Crohn's disease. A person who has had a condition  that causes inflammation of the colon (such as ulcerative colitis or Crohn's disease) for many years is at increased risk of developing colorectal cancer.   Diet. Studies suggest that diets high in fat (especially animal fat) and low in calcium, folate, and fiber may increase the risk of colorectal cancer. Also, some studies suggest that people who eat a diet very low in fruits and vegetables may have a higher risk of colorectal cancer. More research is needed to better understand how diet affects the risk of colorectal cancer.   Cigarette smoking. A person who smokes cigarettes may be at increased risk of developing polyps and colorectal cancer.  SYMPTOMS  Changes in bowel habits.   Diarrhea, constipation, or feeling that the bowel does not empty completely.   Blood (either bright red or very dark) in the stool.   Stools that are narrower than usual.   General discomfort in your belly (abdomen): frequent gas pains, bloating, fullness, and/or cramps.   Weight loss with no known reason.   Constant tiredness.   Nausea and vomiting.  Other health problems can cause the same symptoms, and often these symptoms are not due to cancer. Anyone with these symptoms should see a doctor so that any problem can be diagnosed and treated as early as possible. Usually, early cancer does not cause pain. It is important not to wait to feel pain before seeing a doctor. DIAGNOSIS  If you have any signs or symptoms of   colorectal cancer, the doctor must determine whether they are due to cancer or some other cause. The doctor will ask about personal and family medical history and may do a physical exam. You may have one or more tests to screen for cancer. If the physical exam and test results do not suggest cancer, the doctor may decide that no further tests are needed and no treatment is necessary. Your caregiver may recommend a schedule for checkups. If X-rays show an abnormal area (such as a polyp), a piece of  tissue is taken (biopsy) to check for cancer cells. Often, the abnormal tissue can be removed during a test that examines the colon. These tests include:  Sigmoidoscopy. With this test, the caregiver can see inside your large intestine. A thin flexible tube is placed into your rectum. The device is called a sigmoidoscope. This device has a light and a tiny video camera in it. The caregiver uses the sigmoidoscope to look at the last third of your large intestine.   Colonoscopy. This test is like sigmoidoscopy, but the caregiver looks at all of the large intestine. It usually requires sedation.  If a biopsy was taken, a specialist in examining tissues (pathologist) checks the tissue for cancer cells using a microscope.  If the biopsy shows that cancer is present, the doctor needs to know the extent (stage) of the disease to plan the best treatment. The stage is based on whether the tumor has invaded nearby tissues, whether the cancer has spread, and if so, to what parts of the body. Staging may involve some of the following tests and procedures:  Blood tests. The doctor checks for carcinoembryonic antigen (CEA) and other substances in the blood. Some people who have colorectal cancer have a high CEA level.   Sigmoidoscopy.   Colonoscopy.   Endorectal ultrasound. An ultrasound probe is inserted into the rectum. The probe sends out sound waves that people cannot hear. The waves bounce off the rectum and nearby tissues, and a computer uses the echoes to create a picture. The picture shows how deep a rectal tumor has grown or whether the cancer has spread to lymph nodes or other nearby tissues.   Chest X-ray. X-rays of the chest can show whether cancer has spread to the lungs.   CT scan. This is a X-ray machine linked to a computer takes pictures of areas inside the body. The patient may receive an injection of dye. Tumors in the liver, lungs, or elsewhere in the body show up on the CT scan.  The doctor  also may use other tests (such as MRI) to see whether the cancer has spread. Sometimes staging is not complete until the patient has surgery to remove the tumor. (Surgery for colorectal cancer is described in the "Treatment" section.) DOCTORS DESCRIBE COLORECTAL CANCER BY THE FOLLOWING STAGES:   Stage 0. The cancer is found only in the innermost lining of the colon or rectum. Another name for Stage 0 colorectal cancer is Carcinoma in situ.   Stage I. The cancer has grown into the inner wall of the colon or rectum. The tumor has not reached the outer wall of the colon or extended outside the colon. Another name for Stage I colorectal cancer is Dukes' A.   Stage II. The tumor extends more deeply into or through the wall of the colon or rectum. It may have invaded nearby tissue, but cancer cells have not spread to the lymph nodes. Another name for Stage II colorectal cancer is   Dukes' B.   Stage III. The cancer has spread to nearby lymph nodes but not to other parts of the body. Another name for Stage III colorectal cancer is Dukes' C.   Stage IV. The cancer has spread to other parts of the body, such as the liver or lungs. Another name for Stage IV colorectal cancer is Dukes' D.   Recurrent cancer. This is cancer that has been treated and has returned after a period of time when the cancer could not be detected. The disease may return in the colon, rectum or in another part of the body.  Once you know the diagnosis and the stage of cancer of the colon, your caregiver can advise you and help you decide what the best course of treatment will be.  Document Released: 03/09/2005 Document Revised: 02/26/2011 Document Reviewed: 02/24/2011 [facility] Patient Information 2012 [place], [place].  Diabetic Nephropathy Diabetic nephropathy is a complication of diabetes that leads to damaged kidneys. It develops slowly. The function of healthy kidneys is to filter and clean blood. Kidneys also get rid of body waste  products and extra fluid. When the kidney filters are damaged, there is protein loss in the urine, a decline in kidney function, a buildup of kidney waste products and fluid, and high blood pressure. The damage progresses until the kidneys fail.  RISK FACTORS  High blood pressure (hypertension).   High blood sugar (hyperglycemia).   Family history.   Aging.   Obstruction problems affecting the kidneys, the tubes that drain the kidneys (ureters), or the bladder.   Taking certain drugs or medicines.  SYMPTOMS  Symptoms may not be seen or felt for many years. You may not notice any signs of kidney failure until your kidneys have lost much of their ability to function. An early sign of damage is when small amounts of protein (albumin) leak into the urine. However, this can only be found through a urine test. Without physical symptoms, a urine test is often not performed. When the kidneys fail, you may feel one or more of the following:  Swelling of the hands and feet from the extra fluid in your body.   Constant upset stomach.   Constant fatigue.  DIAGNOSIS When someone has diabetes, screening tests are done to look for any early signs of problems before symptoms develop and before damage has already been done. These tests may include:   Annual urine tests to screen for trace amounts of protein in the urine (microalbuminuria).   Urine collectionover 24 hours to measure kidney function.   Blood tests that measure kidney function.  Your caregiver is aware that problems other than diabetes can damage kidneys. If screening tests show early kidney damage, but it is thought that a different problem is causing the damage, other tests may be performed. Examples of these tests include:  An ultrasound of your kidney.   Taking a tissue sample (biopsy) from the kidney.  TREATMENT The goal of treatment is to prevent or slow down damage to your kidneys. Controlling hypertension and hyperglycemia  is critical. Your goal is to maintain a blood pressure below 130/80. If you have certain other medical problems, this goal may be different. Talk to your caregiver to make sure that your blood pressure goal is right for your needs. Regular testing of your blood glucose at home is important. Your goal is to have a normal blood glucose (110 or less when fasting) as often as possible.  In addition, maintaining your hemoglobin A1c level  at less than 7% reduces your risk for complications, including kidney damage. Common treatments include:  Dieting by controlling what you eat as well as the portion sizes.   Exercising to control blood pressure and blood glucose.   Taking medicines.   Giving yourself insulin injections if your caregiver feels that it is necessary.   Getting early treatment for urinary tract infections.   Regularly following up with your caregiver.  If your disease progresses to end-stage kidney failure, you will need dialysis or a transplant. Dialysis can be done in 1 of 2 ways:  Hemodialysis. Your blood flows from a tube in your arm through a machine. The machine filters waste and extra fluid. The clean blood flows back into your arm.   Peritoneal dialysis. Your abdomen is filled with a special fluid. The fluid collects waste products and extra fluid from your blood. The fluid is then drained from your abdomen and discarded.  SEEK MEDICAL CARE IF:   You are having problems keeping your blood glucose in the goal range.   You have swelling of the hands or feet.   You have weakness.   You have muscles spasms.   You have a constant upset stomach.   You feel tired all the time and this is not normal for you.  SEEK IMMEDIATE MEDICAL CARE IF:  You have unusual dizziness or weakness.   You have excessive sleepiness.   You have a seizure or convulsion.   You have severe, painful muscle spasms.   You have shortness of breath or trouble breathing.   You pass out or  have a fainting episode.   You have chest pains.  MAKE SURE YOU:  Understand these instructions.   Will watch your condition.   Will get help right away if you are not doing well or get worse.  Document Released: 03/29/2007 Document Revised: 02/26/2011 Document Reviewed: 10/29/2010 [facility] Patient Information 2012 [place], [place].

## 2011-08-27 ENCOUNTER — Encounter (INDEPENDENT_AMBULATORY_CARE_PROVIDER_SITE_OTHER): Payer: Self-pay | Admitting: Surgery

## 2011-08-27 ENCOUNTER — Ambulatory Visit (INDEPENDENT_AMBULATORY_CARE_PROVIDER_SITE_OTHER): Payer: Self-pay | Admitting: Surgery

## 2011-08-27 VITALS — BP 116/78 | HR 78 | Temp 98.4°F | Resp 16 | Ht 73.0 in | Wt 314.8 lb

## 2011-08-27 DIAGNOSIS — R197 Diarrhea, unspecified: Secondary | ICD-10-CM | POA: Insufficient documentation

## 2011-08-27 DIAGNOSIS — K651 Peritoneal abscess: Secondary | ICD-10-CM

## 2011-08-27 DIAGNOSIS — C187 Malignant neoplasm of sigmoid colon: Secondary | ICD-10-CM

## 2011-08-27 NOTE — Patient Instructions (Addendum)
Get CT scan of abdomen with rectal contrast to check for a leak or abscess  Get bloodwork  Get stool study to rule out Cdiff colitis diarrhea/infection.  GETTING TO GOOD BOWEL HEALTH. Irregular bowel habits such as constipation and diarrhea can lead to many problems over time.  Having one soft bowel movement a day is the most important way to prevent further problems.  The anorectal canal is designed to handle stretching and feces to safely manage our ability to get rid of solid waste (feces, poop, stool) out of our body.  BUT, hard constipated stools can act like ripping concrete bricks and diarrhea can be a burning fire to this very sensitive area of our body, causing inflamed hemorrhoids, anal fissures, increasing risk is perirectal abscesses, abdominal pain/bloating, an making irritable bowel worse.     The goal: ONE SOFT BOWEL MOVEMENT A DAY!  To have soft, regular bowel movements:    Drink at least 8 tall glasses of water a day.     Take plenty of fiber.  Fiber is the undigested part of plant food that passes into the colon, acting s "natures broom" to encourage bowel motility and movement.  Fiber can absorb and hold large amounts of water. This results in a larger, bulkier stool, which is soft and easier to pass. Work gradually over several weeks up to 6 servings a day of fiber (25g a day even more if needed) in the form of: o Vegetables -- Root (potatoes, carrots, turnips), leafy green (lettuce, salad greens, celery, spinach), or cooked high residue (cabbage, broccoli, etc) o Fruit -- Fresh (unpeeled skin & pulp), Dried (prunes, apricots, cherries, etc ),  or stewed ( applesauce)  o Whole grain breads, pasta, etc (whole wheat)  o Bran cereals    Bulking Agents -- This type of water-retaining fiber generally is easily obtained each day by one of the following:  o Psyllium bran -- The psyllium plant is remarkable because its ground seeds can retain so much water. This product is available as  Metamucil, Konsyl, Effersyllium, Per Diem Fiber, or the less expensive generic preparation in drug and health food stores. Although labeled a laxative, it really is not a laxative.  o Methylcellulose -- This is another fiber derived from wood which also retains water. It is available as Citrucel. o Polyethylene Glycol - and "artificial" fiber commonly called Miralax or Glycolax.  It is helpful for people with gassy or bloated feelings with regular fiber o Flax Seed - a less gassy fiber than psyllium   No reading or other relaxing activity while on the toilet. If bowel movements take longer than 5 minutes, you are too constipated   AVOID CONSTIPATION.  High fiber and water intake usually takes care of this.  Sometimes a laxative is needed to stimulate more frequent bowel movements, but    Laxatives are not a good long-term solution as it can wear the colon out. o Osmotics (Milk of Magnesia, Fleets phosphosoda, Magnesium citrate, MiraLax, GoLytely) are safer than  o Stimulants (Senokot, Castor Oil, Dulcolax, Ex Lax)    o Do not take laxatives for more than 7days in a row.    IF SEVERELY CONSTIPATED, try a Bowel Retraining Program: o Do not use laxatives.  o Eat a diet high in roughage, such as bran cereals and leafy vegetables.  o Drink six (6) ounces of prune or apricot juice each morning.  o Eat two (2) large servings of stewed fruit each day.  o Take  one (1) heaping tablespoon of a psyllium-based bulking agent twice a day. Use sugar-free sweetener when possible to avoid excessive calories.  o Eat a normal breakfast.  o Set aside 15 minutes after breakfast to sit on the toilet, but do not strain to have a bowel movement.  o If you do not have a bowel movement by the third day, use an enema and repeat the above steps.    Controlling diarrhea o Switch to liquids and simpler foods for a few days to avoid stressing your intestines further. o Avoid dairy products (especially milk & ice cream) for a  short time.  The intestines often can lose the ability to digest lactose when stressed. o Avoid foods that cause gassiness or bloating.  Typical foods include beans and other legumes, cabbage, broccoli, and dairy foods.  Every person has some sensitivity to other foods, so listen to our body and avoid those foods that trigger problems for you. o Adding fiber (Citrucel, Metamucil, psyllium, Miralax) gradually can help thicken stools by absorbing excess fluid and retrain the intestines to act more normally.  Slowly increase the dose over a few weeks.  Too much fiber too soon can backfire and cause cramping & bloating. o Probiotics (such as active yogurt, Align, etc) may help repopulate the intestines and colon with normal bacteria and calm down a sensitive digestive tract.  Most studies show it to be of mild help, though, and such products can be costly. o Medicines:   Bismuth subsalicylate (ex. Kayopectate, Pepto Bismol) every 30 minutes for up to 6 doses can help control diarrhea.  Avoid if pregnant.   Loperamide (Immodium) can slow down diarrhea.  Start with two tablets (4mg  total) first and then try one tablet every 6 hours.  Avoid if you are having fevers or severe pain.  If you are not better or start feeling worse, stop all medicines and call your doctor for advice o Call your doctor if you are getting worse or not better.  Sometimes further testing (cultures, endoscopy, X-ray studies, bloodwork, etc) may be needed to help diagnose and treat the cause of the diarrhea. o

## 2011-08-27 NOTE — Progress Notes (Signed)
Subjective:     Patient ID: Andrew Walls, male   DOB: 1961/01/23, 51 y.o.   MRN: [id number]  HPI   Andrew Walls  1960-09-29 [id number]  Patient Care Team: Ignatius Specking, MD as PCP - General (Internal Medicine) Erskine Speed as Consulting Physician (General Surgery) Michae Kava, MD as Consulting Physician (Hematology and Oncology)  This patient is a 51 y.o.male who presents today for surgical evaluation at the request of ?Dr. Ubaldo Glassing.   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 . 0/18 lymph nodes equals =N0.  One margin 1.5 cm. Another margin 5.5 cm.  Anastomotic rings not mentioned but performed in surgery.  Patient is a morbidly obese a poorly controlled diabetic male. He's had repeated episodes of colon inflammation presumed to be diverticulitis since 2006. He was finally convinced to undergo colonoscopy by his primary care physician. Dr. Gabriel Cirri noted a cancer at the rectosigmoid junction. Patient underwent an open sigmoid colectomy within a few days. Staged as a T3 N0. Patient was being considered for post adjuvant chemotherapy. However has issues of probable neuropathy and so had not been continued.  He completed 2 weeks of oral antibiotics. The Augmentin gave him diarrhea. It was rather rough for a while. That has tapered off since he stopped last week. However his bowels are not normal. Noticed blood one time.   His appetite has been poor. He's avoided eating for days at a time. Not trying oral shakes/supplements. Some sweats but no major fever or chills.  Feels like his abdominal pain is starting to come back this week off antibiotics  Glucose is running 100-150. He comes today with a friend. He feels wiped out. He has some good days and bad days  Patient Active Problem List  Diagnoses  .  Diabetes mellitus  . Cancer of sigmoid colon, s/p colectomy Jan2013, cT4pN0 (5.5cm, 0/18 LN), K-ras (-)  . Morbid obesity with BMI of 40.0-44.9, adult  . Dizziness - light-headed  . Diabetic neuropathy, type II diabetes mellitus  . Depression  . Postoperative intra-abdominal abscess  . Steatohepatitis  . Diarrhea    Past Medical History  Diagnosis Date  . Diabetes mellitus 08/03/2011  . Cancer of sigmoid colon, s/p colectomy [id number], pT3pN0 (0/8 LN) 08/03/2011  . Morbid obesity with BMI of 40.0-44.9, adult 08/03/2011  . Diverticulitis of colon 2006,2011,2012,2013    ?really perforated colon cancer    Past Surgical History  Procedure Date  . Abdominal surgery   . Colon surgery 04/23/2011    left colectomy    History   Social History  . Marital Status: Single    Spouse Name: N/A    Number of Children: N/A  . Years of Education: N/A   Occupational History  . Not on file.   Social History Main Topics  . Smoking status: Never Smoker   . Smokeless tobacco: Not on file  . Alcohol Use: No  . Drug Use:   . Sexually Active:    Other Topics Concern  . Not on file   Social History Narrative  . No narrative on file    Family History  Problem Relation Age of Onset  . Heart disease Mother   . Cancer Father     melanoma    Current Outpatient Prescriptions  Medication Sig Dispense Refill  . cetirizine (ZYRTEC) 10 MG tablet Take 10  mg by mouth daily.      Marland Kitchen desvenlafaxine (PRISTIQ) 50 MG 24 hr tablet Take 50 mg by mouth daily.      . diclofenac (VOLTAREN) 50 MG EC tablet Take 50 mg by mouth 2 (two) times daily as needed. For pain      . diphenhydrAMINE (SOMINEX) 25 MG tablet Take 25 mg by mouth at bedtime as needed. For sleep or itching      . glipiZIDE (GLUCOTROL) 10 MG tablet Take 10 mg by mouth 2 (two) times daily before a meal.      . ibuprofen (ADVIL,MOTRIN) 200 MG tablet Take 600 mg by mouth every 6 (six) hours as needed. For pain      . meclizine (ANTIVERT) 25 MG  tablet Take 25 mg by mouth 2 (two) times daily as needed. For dizziness      . metFORMIN (GLUCOPHAGE) 500 MG tablet Take 500 mg by mouth 2 (two) times daily with a meal. 500 mg in the am and 1000mg  at bedtime      . Ondansetron HCl (ZOFRAN PO) Take 50 mg by mouth 2 (two) times daily as needed. For nausea      . traZODone (DESYREL) 100 MG tablet Take 200 mg by mouth at bedtime.         No Known Allergies  BP 116/78  Pulse 78  Temp 98.4 F (36.9 C)  Resp 16  Ht 6\' 1"  (1.854 m)  Wt 314 lb 12.8 oz (142.792 kg)  BMI 41.53 kg/m2  Dg Abd 1 View  07/23/2011  *RADIOLOGY REPORT*  Clinical Data: Abdominal pain.  ABDOMEN - 1 VIEW  Comparison: CT scan 07/15/2011.  Findings: The bowel gas pattern is unremarkable.  No findings for small bowel obstruction.  The soft tissue shadows of the abdomen maintained.  The lung bases are not included but no obvious free air.  The bony structures are intact.  Moderate degenerative changes involving both hips.  IMPRESSION:  No plain film findings for an acute abdominal process.  Original Report Authenticated By: P. Loralie Champagne, M.D.   Ct Head Wo Contrast  07/23/2011  *RADIOLOGY REPORT*  Clinical Data: Headache, dizziness, history of carcinoma of the colon  CT HEAD WITHOUT CONTRAST  Technique:  Contiguous axial images were obtained from the base of the skull through the vertex without contrast.  Comparison: 11/11/2010 Correlation:  MRI brain 07/01/2011  Findings: Extensive falcine dural calcification again identified. Generalized atrophy. Normal ventricular morphology. No midline shift or mass effect. No definite intracranial hemorrhage, mass lesion, or evidence of acute infarction. No definite extra-axial fluid collections. 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 Visualized bones and sinuses unremarkable.  IMPRESSION: No acute intracranial abnormalities.  Original Report Authenticated By: Lollie Marrow, M.D.   Ct Abdomen Pelvis W Contrast  07/24/2011  *RADIOLOGY REPORT*  Clinical Data: Generalized weakness, recent colon surgery, questionable leak.  CT ABDOMEN AND PELVIS WITH CONTRAST  Technique:  Multidetector CT imaging of the abdomen and pelvis was performed following the standard protocol during bolus administration of intravenous contrast.  Contrast: OMNIPAQUE IOHEXOL 300 MG/ML  SOLN  Comparison: 07/15/2011 CT performed at [facility]  Findings: Limited images through the lung bases demonstrate no significant appreciable abnormality. The heart size is within normal limits. No pleural or pericardial effusion.  Hepatic steatosis.  Variant enhancement of the posterior segment right hepatic lobe versus area of fatty sparing.  Unremarkable spleen,  pancreas, biliary system, adrenal glands.  Symmetric renal enhancement.  No hydronephrosis or hydroureter.  Status post partial colectomy with anastomotic suture noted in the region of the sigmoid colon.  Again noted is adjacent fat stranding and associated tract that extends into the adjacent fat however without definite communication with adjacent bowel.  Decreased extraluminal gas in the interval.  No bowel obstruction.  Decompressed bladder.  No lymphadenopathy.  There is scattered atherosclerotic calcification of the aorta and its branches. No aneurysmal dilatation.  Multilevel degenerative changes of the imaged spine. No acute or aggressive appearing osseous lesion.  IMPRESSION: Surgical anastomosis at the sigmoid colon with adjacent inflammatory change/tract is again noted.  No fluid collection or fistulous communication to the nearby small bowel identified. This may represent sequelae of a small anastomotic leak that has walled off as there has been interval near resolution of the previously noted extraluminal gas. A follow-up CT after administration of rectal contrast may provide more information to exclude leak if clinically warranted.   Hepatic steatosis.  Original Report Authenticated By: Waneta Martins, M.D.     Review of Systems  Constitutional: Positive for diaphoresis and appetite change. Negative for fever and chills.  HENT: Negative for nosebleeds, sore throat, facial swelling, mouth sores and ear discharge.   Eyes: Negative for photophobia and discharge.  Respiratory: Negative for cough, choking, chest tightness, wheezing and stridor.   Cardiovascular: Negative for leg swelling.       Patient walks 10 minutes for about .25  miles without difficulty before getting SOB.  No exertional chest/neck/shoulder/arm pain.  Gastrointestinal: Positive for nausea, abdominal pain and blood in stool. Negative for diarrhea, constipation, abdominal distention, anal bleeding and rectal pain.  Genitourinary: Negative for dysuria, urgency, difficulty urinating and testicular pain.  Musculoskeletal: Negative for myalgias, back pain, arthralgias and gait problem.  Skin: Negative for color change, pallor, rash and wound.  Neurological: Positive for weakness. Negative for dizziness, speech difficulty and numbness.  Hematological: Negative for adenopathy. Does not bruise/bleed easily.  Psychiatric/Behavioral: Negative for hallucinations, confusion and agitation.       Objective:   Physical Exam  Constitutional: He is oriented to person, place, and time. He appears well-developed and well-nourished. No distress.  HENT:  Head: Normocephalic.  Mouth/Throat: Oropharynx is clear and moist. No oropharyngeal exudate.  Eyes: Conjunctivae and EOM are normal. Pupils are equal, round, and reactive to light. No scleral icterus.  Neck: Normal range of motion. Neck supple. No tracheal deviation present.  Cardiovascular: Normal rate, regular rhythm and intact distal pulses.   Pulmonary/Chest: Effort normal and breath sounds normal. No respiratory distress.  Abdominal: Soft. He exhibits no distension. There is no tenderness. There is negative  Murphy's sign. Hernia confirmed negative in the right inguinal area and confirmed negative in the left inguinal area.    Musculoskeletal: Normal range of motion. He exhibits no tenderness.  Lymphadenopathy:    He has no cervical adenopathy.       Right: No inguinal adenopathy present.       Left: No inguinal adenopathy present.  Neurological: He is alert and oriented to person, place, and time. No cranial nerve deficit. He exhibits normal muscle tone. Coordination normal.  Skin: Skin is warm and dry. No rash noted. He is not diaphoretic. No erythema. No pallor.  Psychiatric: He has a normal mood and affect. His behavior is normal. Judgment and thought content normal.   Dg Abd 1 View  07/23/2011  *RADIOLOGY REPORT*  Clinical Data: Abdominal pain.  ABDOMEN - 1 VIEW  Comparison: CT scan 07/15/2011.  Findings: The bowel gas pattern is unremarkable.  No findings for small bowel obstruction.  The soft tissue shadows of the abdomen maintained.  The lung bases are not included but no obvious free air.  The bony structures are intact.  Moderate degenerative changes involving both hips.  IMPRESSION:  No plain film findings for an acute abdominal process.  Original Report Authenticated By: P. Loralie Champagne, M.D.   Ct Head Wo Contrast  07/23/2011  *RADIOLOGY REPORT*  Clinical Data: Headache, dizziness, history of carcinoma of the colon  CT HEAD WITHOUT CONTRAST  Technique:  Contiguous axial images were obtained from the base of the skull through the vertex without contrast.  Comparison: 11/11/2010 Correlation:  MRI brain 07/01/2011  Findings: Extensive falcine dural calcification again identified. Generalized atrophy. Normal ventricular morphology. No midline shift or mass effect. No definite intracranial hemorrhage, mass lesion, or evidence of acute infarction. No definite extra-axial fluid collections. The small 9 mm parafalcine meningioma identified on prior MR is not distinguishable from the dural  calcifications by CT. Visualized bones and sinuses unremarkable.  IMPRESSION: No acute intracranial abnormalities.  Original Report Authenticated By: Lollie Marrow, M.D.   Ct Abdomen Pelvis W Contrast  07/24/2011  *RADIOLOGY REPORT*  Clinical Data: Generalized weakness, recent colon surgery, questionable leak.  CT ABDOMEN AND PELVIS WITH CONTRAST  Technique:  Multidetector CT imaging of the abdomen and pelvis was performed following the standard protocol during bolus administration of intravenous contrast.  Contrast: OMNIPAQUE IOHEXOL 300 MG/ML  SOLN  Comparison: 07/15/2011 CT [facility] at [facility]  Findings: Limited images through the lung bases demonstrate no significant appreciable abnormality. The heart size is within normal limits. No pleural or pericardial effusion.  Hepatic steatosis.  Variant enhancement of the posterior segment right hepatic lobe versus area of fatty sparing.  Unremarkable spleen, pancreas, biliary system, adrenal glands.  Symmetric renal enhancement.  No hydronephrosis or hydroureter.  Status post partial colectomy with anastomotic suture noted in the region of the sigmoid colon.  Again noted is adjacent fat stranding and associated tract that extends into the adjacent fat however without definite communication with adjacent bowel.  Decreased extraluminal gas in the interval.  No bowel obstruction.  Decompressed bladder.  No lymphadenopathy.  There is scattered atherosclerotic calcification of the aorta and its branches. No aneurysmal dilatation.  Multilevel degenerative changes of the imaged spine. No acute or aggressive appearing osseous lesion.  IMPRESSION: Surgical anastomosis at the sigmoid colon with adjacent inflammatory change/tract is again noted.  No fluid collection or fistulous communication to the nearby small bowel identified. This may represent sequelae of a small anastomotic leak that has walled off as there has been interval near resolution of the  previously noted extraluminal gas. A follow-up CT after administration of rectal contrast may provide more information to exclude leak if clinically warranted.  Hepatic steatosis.  Original Report Authenticated By: Waneta Martins, M.D.     Assessment:     Over 4 months status post colectomy for stage II colon cancer, node negative. Probable T4 given history of prior perforations and no diverticulitis/osis noted on the pathology report.  Postoperative recurrent pain and symptoms off Abx. Improving CT scan in past. Possible persistent cellulitis/abscess near the anastomosis.     Plan:     CT scan with rectal contrast to rule out a true anastomotic leak.  CBC, CMET also R/o Cdiff   Call me after these results are done,  sooner if not improved.  Patient requires a followup colonoscopy a year out from surgery. January 2014.   Repeat CEA since 3 months out and had it elevated preoperatively (13.5)  The patient wishes me to continue to surgical care. I stressed to him it is important to call me if he is still struggling or having problems. I gave him my card again.

## 2011-08-28 ENCOUNTER — Telehealth (INDEPENDENT_AMBULATORY_CARE_PROVIDER_SITE_OTHER): Payer: Self-pay | Admitting: General Surgery

## 2011-08-28 ENCOUNTER — Ambulatory Visit (HOSPITAL_COMMUNITY)
Admission: AD | Admit: 2011-08-28 | Discharge: 2011-08-28 | Disposition: A | Discharge status: Home / Self Care | Payer: Medicaid Other | Attending: Surgery | Admitting: Surgery

## 2011-08-28 DIAGNOSIS — Z01818 Encounter for other preprocedural examination: Secondary | ICD-10-CM | POA: Insufficient documentation

## 2011-08-28 DIAGNOSIS — R198 Other specified symptoms and signs involving the digestive system and abdomen: Secondary | ICD-10-CM | POA: Insufficient documentation

## 2011-08-28 DIAGNOSIS — Z01812 Encounter for preprocedural laboratory examination: Secondary | ICD-10-CM | POA: Insufficient documentation

## 2011-08-28 LAB — DIFFERENTIAL
Eosinophils Relative: 7 % — ABNORMAL HIGH (ref 0–5)
Lymphocytes Relative: 15 % (ref 12–46)
Lymphs Abs: 1.4 10*3/uL (ref 0.7–4.0)
Monocytes Absolute: 0.6 10*3/uL (ref 0.1–1.0)

## 2011-08-28 LAB — COMPREHENSIVE METABOLIC PANEL
ALT: 26 U/L (ref 0–53)
AST: 16 U/L (ref 0–37)
Calcium: 9.4 mg/dL (ref 8.4–10.5)
Sodium: 139 mEq/L (ref 135–145)
Total Protein: 7.3 g/dL (ref 6.0–8.3)

## 2011-08-28 LAB — CBC
MCHC: 34.2 g/dL (ref 30.0–36.0)
Platelets: 285 10*3/uL (ref 150–400)
RDW: 14.7 % (ref 11.5–15.5)

## 2011-08-28 LAB — CLOSTRIDIUM DIFFICILE BY PCR: Toxigenic C. Difficile by PCR: NEGATIVE

## 2011-08-28 NOTE — Telephone Encounter (Signed)
Pt calling to report he cannot get his blood drawn (went to [place] [facility]) because he has no insurance and cannot afford out-of-pocket to pay for it.

## 2011-08-28 NOTE — Telephone Encounter (Signed)
Called pt and asked him to go to [place] to try and have his blood drawn there since they usually work with people on making payments instead of an up-front payment.  He said he would go do this today.

## 2011-08-28 NOTE — Telephone Encounter (Signed)
Called pt and let him know that I called [facility] and pushed up his CT scan from 6/11 to 6/10 at 9:00.  I told him he needs to be there at 8:45.  Pt is aware and said that he would be there.  The patient wanted me to tell Dr. Michaell Cowing and let him know that he is constantly smelling feces.  He will then go check to make sure he hasn't had an accident; and there will is not ever any leakage.

## 2011-08-28 NOTE — Telephone Encounter (Signed)
Called pt and he explained that he could not afford the up-front payment that [facility] was going to require.  I told him to go to [facility] and try to get it drawn there.  He said he would do this today and that he would call me with any further problems.

## 2011-08-28 NOTE — Telephone Encounter (Signed)
Called [facility] and somehow got them to agree to go ahead and draw his blood today.  Pt is having it done at this moment.

## 2011-08-28 NOTE — Progress Notes (Signed)
Pt is aware his labs are normal and knows about his CT scan being on 6/10 at 9:00.

## 2011-08-29 LAB — CEA: CEA: 2 ng/mL (ref 0.0–5.0)

## 2011-08-31 ENCOUNTER — Telehealth (INDEPENDENT_AMBULATORY_CARE_PROVIDER_SITE_OTHER): Payer: Self-pay

## 2011-08-31 ENCOUNTER — Other Ambulatory Visit (INDEPENDENT_AMBULATORY_CARE_PROVIDER_SITE_OTHER): Payer: Self-pay | Admitting: Surgery

## 2011-08-31 ENCOUNTER — Ambulatory Visit (HOSPITAL_COMMUNITY)
Admission: RE | Admit: 2011-08-31 | Discharge: 2011-08-31 | Disposition: A | Discharge status: Home / Self Care | Payer: Medicaid Other | Source: Ambulatory Visit | Attending: Surgery | Admitting: Surgery

## 2011-08-31 ENCOUNTER — Telehealth (INDEPENDENT_AMBULATORY_CARE_PROVIDER_SITE_OTHER): Payer: Self-pay | Admitting: General Surgery

## 2011-08-31 ENCOUNTER — Encounter (HOSPITAL_COMMUNITY): Payer: Self-pay

## 2011-08-31 DIAGNOSIS — Z9889 Other specified postprocedural states: Secondary | ICD-10-CM | POA: Insufficient documentation

## 2011-08-31 DIAGNOSIS — Z9049 Acquired absence of other specified parts of digestive tract: Secondary | ICD-10-CM

## 2011-08-31 DIAGNOSIS — R197 Diarrhea, unspecified: Secondary | ICD-10-CM

## 2011-08-31 DIAGNOSIS — K651 Peritoneal abscess: Secondary | ICD-10-CM | POA: Insufficient documentation

## 2011-08-31 DIAGNOSIS — T8140XA Infection following a procedure, unspecified, initial encounter: Secondary | ICD-10-CM | POA: Insufficient documentation

## 2011-08-31 DIAGNOSIS — C187 Malignant neoplasm of sigmoid colon: Secondary | ICD-10-CM

## 2011-08-31 MED ORDER — IOHEXOL 300 MG/ML  SOLN
100.0000 mL | Freq: Once | INTRAMUSCULAR | Status: AC | PRN
Start: 2011-08-31 — End: 2011-08-31
  Administered 2011-08-31: 100 mL via INTRAVENOUS

## 2011-08-31 NOTE — Telephone Encounter (Signed)
Pt completed his CT scan and wanted to know if he could leave.  I told him this was okay to do and that we will call him as soon as we get his CT results.  I also informed him that his cdiff test came back negative.

## 2011-08-31 NOTE — Telephone Encounter (Signed)
Called pt to notify him that I had one of Dr Gordy Savers partners to look at his CT results today. Per Dr Dwain Sarna the pt needs to come into Urgent office to see Dr Jamey Ripa for antibiotics. I explained Dr Michaell Cowing is on vacation tomorrow and the pt needs to be seen to discuss antibiotics vs.drain placement. The pt understands. The appt is for 09/01/11 arrive at 2:30.

## 2011-08-31 NOTE — Telephone Encounter (Signed)
Pt called back to request clarification on CT results given to him earlier today.  Please call.

## 2011-09-01 ENCOUNTER — Encounter (INDEPENDENT_AMBULATORY_CARE_PROVIDER_SITE_OTHER): Payer: Self-pay | Admitting: Surgery

## 2011-09-01 ENCOUNTER — Inpatient Hospital Stay (HOSPITAL_COMMUNITY): Admission: RE | Admit: 2011-09-01 | Payer: Self-pay | Source: Ambulatory Visit

## 2011-09-01 ENCOUNTER — Ambulatory Visit (INDEPENDENT_AMBULATORY_CARE_PROVIDER_SITE_OTHER): Payer: Self-pay | Admitting: Surgery

## 2011-09-01 VITALS — BP 114/74 | HR 72 | Temp 96.8°F | Resp 16 | Ht 74.0 in | Wt 317.4 lb

## 2011-09-01 DIAGNOSIS — K651 Peritoneal abscess: Secondary | ICD-10-CM

## 2011-09-01 MED ORDER — METRONIDAZOLE 500 MG PO TABS: 500.0000 mg | ORAL_TABLET | Freq: Three times a day (TID) | ORAL | Status: DC

## 2011-09-01 MED ORDER — CIPROFLOXACIN HCL 500 MG PO TABS: 500.0000 mg | ORAL_TABLET | Freq: Two times a day (BID) | ORAL | Status: DC

## 2011-09-01 NOTE — Patient Instructions (Signed)
We will give you one more course of antibiotics: Cipro taken two times daily and Flagyl taken three times daily. See Dr Michaell Cowing in about two weeks

## 2011-09-01 NOTE — Progress Notes (Signed)
Chief complaint: Followup of CT scan done to evaluate for possible anastomotic leak.  History of present illness: This patient had a colectomy in January. He has had ongoing issues with perianastomotic inflammatory changes. He had a course of Augmentin a few weeks ago that resulted in some diarrhea. He continues to have some chills. A followup CT was ordered to make sure he does not have a leak at his anastomosis. This was just accomplished he came in to discuss that.  He overall is eating okay. The diarrhea stopped. He is having significant abdominal pain. He does have some chills but no fevers  Exam: Vital signs: General: The patient is overweight alert healthy no acute distress Abdomen: Abdomen is soft and nontender. The wound is well-healed. Bowel sounds are present.  Data reviewed: I looked over his notes for his prior visits here and his prior and current CT scans. The most recent CT scan was done with rectal contrast. Inflammatory changes seen on a prior CT scan had improved. There is a tiny bit of air noted extraluminally but there was no evidence of leak with the rectal contrast.  Impression:. Perianastomotic inflammatory process without obvious leak  Plan: I think he needs to have another course of antibiotics. Will try Cipro 500 b.i.d. and Flagyl 500 t.i.d. for about 10 days and then have followup here.

## 2011-09-02 ENCOUNTER — Telehealth (INDEPENDENT_AMBULATORY_CARE_PROVIDER_SITE_OTHER): Payer: Self-pay

## 2011-09-02 MED ORDER — CIPROFLOXACIN HCL 500 MG PO TABS: 500.0000 mg | ORAL_TABLET | Freq: Two times a day (BID) | ORAL | Status: AC

## 2011-09-02 MED ORDER — METRONIDAZOLE 500 MG PO TABS: 500.0000 mg | ORAL_TABLET | Freq: Three times a day (TID) | ORAL | Status: AC

## 2011-09-02 NOTE — Telephone Encounter (Signed)
Called pt to let him know that Dr Michaell Cowing wants pt to stay on antibiotics until the pt comes back on 7/1 to see him. I reordered the refills on Cipro and Flagyl to pt's pharmacy. I made a CT scan appt for 6/28 to be done in f/u per Dr Michaell Cowing.

## 2011-09-14 ENCOUNTER — Telehealth (INDEPENDENT_AMBULATORY_CARE_PROVIDER_SITE_OTHER): Payer: Self-pay

## 2011-09-14 NOTE — Telephone Encounter (Signed)
Called pt to discuss our plan we have in place about getting CT scan on 6/28 and f/u appt with Dr Michaell Cowing on 7/1 b/c after Dr Michaell Cowing reviewed plan he advised we push the CT scan out a full 3wks of being the antibiotics. I told the pt that I was going to work on getting this r/s but the pt told me he was going to call me today to get me to cancel the CT scan.The pt was denied for Medicaid again so he does not have any money to keep getting scans. The pt is still on the antibioitics. I told the pt that I would go over this info with Dr Michaell Cowing and get back to him.  I advised Dr Michaell Cowing of the situation with the pt and Dr Michaell Cowing advised that as long as  The pt is feeling better we could hold off on the CT scan for now. The pt will still need to come in for his f/u appt on 7/1. The pt states the diarrhea has gotten better but he still gets a pain on his left side when he eats. The pt said no matter what he will not get the CT scan or any other tests b/c he can't keep racking up his medical bills. I did advise the pt for him to just keep the appt with Dr Michaell Cowing on 09/21/11. The pt agrees.

## 2011-09-16 ENCOUNTER — Encounter (INDEPENDENT_AMBULATORY_CARE_PROVIDER_SITE_OTHER): Payer: Self-pay

## 2011-09-18 ENCOUNTER — Other Ambulatory Visit: Payer: Self-pay

## 2011-09-21 ENCOUNTER — Encounter (INDEPENDENT_AMBULATORY_CARE_PROVIDER_SITE_OTHER): Payer: Self-pay | Admitting: Surgery

## 2011-09-30 ENCOUNTER — Encounter (INDEPENDENT_AMBULATORY_CARE_PROVIDER_SITE_OTHER): Payer: Self-pay

## 2011-11-05 ENCOUNTER — Encounter: Payer: Self-pay | Admitting: Internal Medicine

## 2011-11-24 ENCOUNTER — Encounter: Payer: Self-pay | Admitting: Internal Medicine

## 2011-11-24 DIAGNOSIS — C189 Malignant neoplasm of colon, unspecified: Secondary | ICD-10-CM

## 2011-11-27 ENCOUNTER — Telehealth (INDEPENDENT_AMBULATORY_CARE_PROVIDER_SITE_OTHER): Payer: Self-pay | Admitting: General Surgery

## 2011-11-27 ENCOUNTER — Ambulatory Visit (INDEPENDENT_AMBULATORY_CARE_PROVIDER_SITE_OTHER): Payer: Medicaid Other | Admitting: Surgery

## 2011-11-27 ENCOUNTER — Encounter (INDEPENDENT_AMBULATORY_CARE_PROVIDER_SITE_OTHER): Payer: Self-pay | Admitting: Surgery

## 2011-11-27 VITALS — BP 126/78 | HR 102 | Temp 97.6°F | Ht 74.0 in | Wt 317.6 lb

## 2011-11-27 DIAGNOSIS — R1032 Left lower quadrant pain: Secondary | ICD-10-CM

## 2011-11-27 DIAGNOSIS — G8929 Other chronic pain: Secondary | ICD-10-CM

## 2011-11-27 DIAGNOSIS — C187 Malignant neoplasm of sigmoid colon: Secondary | ICD-10-CM

## 2011-11-27 LAB — CBC WITH DIFFERENTIAL/PLATELET
Lymphocytes Relative: 18 % (ref 12–46)
Lymphs Abs: 1 10*3/uL (ref 0.7–4.0)
MCV: 83.8 fL (ref 78.0–100.0)
Neutrophils Relative %: 67 % (ref 43–77)
Platelets: 225 10*3/uL (ref 150–400)
RBC: 4.57 MIL/uL (ref 4.22–5.81)
WBC: 5.7 10*3/uL (ref 4.0–10.5)

## 2011-11-27 LAB — COMPREHENSIVE METABOLIC PANEL
Albumin: 4 g/dL (ref 3.5–5.2)
CO2: 24 mEq/L (ref 19–32)
Glucose, Bld: 169 mg/dL — ABNORMAL HIGH (ref 70–99)
Sodium: 140 mEq/L (ref 135–145)
Total Bilirubin: 0.8 mg/dL (ref 0.3–1.2)
Total Protein: 6.4 g/dL (ref 6.0–8.3)

## 2011-11-27 NOTE — Progress Notes (Signed)
Subjective:     Patient ID: Andrew Walls, male   DOB: [birth year], 51 y.o.   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 . 0/18 lymph nodes equals =N0.  One margin 1.5 cm. Another margin 5.5 cm.  Anastomotic rings not mentioned but performed in surgery.  Patient is a morbidly obese a poorly controlled diabetic male. He's had repeated episodes of colon inflammation presumed to be diverticulitis since 2006. He was finally convinced to undergo colonoscopy by his primary care physician. Dr. Gabriel Walls noted a cancer at the rectosigmoid junction. Patient underwent an open sigmoid colectomy within a few days. Staged as a T3 N0. Patient was being considered for post adjuvant chemotherapy. However has issues of probable neuropathy and so had not been started.  He had been open wound at his bellybutton. It was followed by the wound center in [place] and eventually closed. He has had issues with abdominal pain. He had CAT scans done postoperatively. Stranding and gas was seen near the anastomosis, suspicious for leak versus abscess on a CT scan in March.  He has had repeat CT scans for the inflammation has gone down.  I recommended a followup evaluation in June.  He disappeared to do to lack of funds.  He shows up again today after getting some insurance.  He tells me he still has some left-sided pain noticed not as severe.  Has some loose stools  about 3-4 day.  He used to have about 2-3 a day before surgery.  Energy level better.  Appetite better.  Trying to lose weight.  Comes again with his friend.  No nausea or vomiting.  No difficulty with urination.     Patient Active Problem List  Diagnosis  . Diabetes mellitus  . Cancer of sigmoid colon, s/p colectomy Jan2013, cT4pN0 (5.5cm, 0/18 LN), K-ras (-)  . Morbid obesity with BMI of 40.0-44.9, adult  . Dizziness - light-headed  . Diabetic neuropathy, type II diabetes mellitus  . Depression  . Postoperative intra-abdominal abscess  . Steatohepatitis  . Diarrhea  . Abdominal pain, chronic, left lower quadrant    Past Medical History  Diagnosis Date  . Diabetes mellitus 08/03/2011  . Cancer of sigmoid colon, s/p colectomy [id number], pT3pN0 (0/8 LN) 08/03/2011  . Morbid obesity with BMI of 40.0-44.9, adult 08/03/2011  . Diverticulitis of colon 2006,2011,2012,2013    ?really perforated colon cancer    Past Surgical History  Procedure Date  . Abdominal surgery   . Colon surgery 04/23/2011    left colectomy    History   Social History  . Marital Status: Single    Spouse Name: N/A    Number of Children: N/A  . Years of Education: N/A   Occupational History  . Not on file.   Social History Main Topics  . Smoking status: Never Smoker   . Smokeless tobacco: Not on file  . Alcohol Use: No  . Drug Use: No  . Sexually Active: Not on file  Other Topics Concern  . Not on file   Social History Narrative  . No narrative on file    Family History  Problem Relation Age of Onset  . Heart disease Mother   . Cancer Father     melanoma    Current Outpatient Prescriptions  Medication Sig Dispense Refill  . gabapentin (NEURONTIN) 300 MG capsule Take 300 mg by mouth at bedtime.      Marland Kitchen glipiZIDE (GLUCOTROL) 10 MG tablet Take 10 mg by mouth 2 (two) times daily before a meal.      . ibuprofen (ADVIL,MOTRIN) 200 MG tablet Take 600 mg by mouth as needed. For pain      .  metFORMIN (GLUCOPHAGE) 500 MG tablet Take 500 mg by mouth 3 (three) times daily. 500 mg in the am and 1000mg  at bedtime      . triamcinolone cream (KENALOG) 0.1 % Apply 1 application topically 2 (two) times daily.         No Known Allergies  BP 126/78  Pulse 102  Temp 97.6 F (36.4 C) (Temporal)  Ht 6\' 2"  (1.88 m)  Wt 317 lb 9.6 oz (144.062 kg)  BMI 40.78 kg/m2  SpO2 99%  Dg Abd 1 View  07/23/2011  *RADIOLOGY REPORT*  Clinical Data: Abdominal pain.  ABDOMEN - 1 VIEW  Comparison: CT scan 07/15/2011.  Findings: The bowel gas pattern is unremarkable.  No findings for small bowel obstruction.  The soft tissue shadows of the abdomen maintained.  The lung bases are not included but no obvious free air.  The bony structures are intact.  Moderate degenerative changes involving both hips.  IMPRESSION:  No plain film findings for an acute abdominal process.  Original Report Authenticated By: P. Loralie Champagne, M.D.   Ct Head Wo Contrast  07/23/2011  *RADIOLOGY REPORT*  Clinical Data: Headache, dizziness, history of carcinoma of the colon  CT HEAD WITHOUT CONTRAST  Technique:  Contiguous axial images were obtained from the base of the skull through the vertex without contrast.  Comparison: 11/11/2010 Correlation:  MRI brain 07/01/2011  Findings: Extensive falcine dural calcification again identified. Generalized atrophy. Normal ventricular morphology. No midline shift or mass effect. No definite intracranial hemorrhage, mass lesion, or evidence of acute infarction. No definite extra-axial fluid collections. 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 Visualized bones and sinuses unremarkable.  IMPRESSION: No acute intracranial abnormalities.  Original Report Authenticated By: Lollie Marrow, M.D.   Ct Abdomen Pelvis W Contrast  07/24/2011  *RADIOLOGY REPORT*  Clinical Data: Generalized weakness, recent colon surgery, questionable  leak.  CT ABDOMEN AND PELVIS WITH CONTRAST  Technique:  Multidetector CT imaging of the abdomen and pelvis was performed following the standard protocol during bolus administration of intravenous contrast.  Contrast: OMNIPAQUE IOHEXOL 300 MG/ML  SOLN  Comparison: 07/15/2011 CT [facility] at [facility]  Findings: Limited images through the lung bases demonstrate no significant appreciable abnormality. The heart size is within normal limits. No pleural or pericardial effusion.  Hepatic steatosis.  Variant enhancement of the posterior segment right hepatic lobe versus area of fatty sparing.  Unremarkable spleen, pancreas, biliary system, adrenal glands.  Symmetric renal enhancement.  No hydronephrosis or hydroureter.  Status post partial colectomy with anastomotic suture noted in the region of the sigmoid colon.  Again noted is adjacent fat stranding and associated tract that extends into the adjacent fat however without definite communication with adjacent bowel.  Decreased extraluminal gas in the interval.  No bowel obstruction.  Decompressed bladder.  No lymphadenopathy.  There is scattered atherosclerotic calcification of the aorta and its branches. No aneurysmal dilatation.  Multilevel degenerative changes of the imaged spine. No acute or aggressive appearing osseous lesion.  IMPRESSION: Surgical anastomosis at the sigmoid colon with adjacent inflammatory change/tract is again noted.  No fluid collection or fistulous communication to the nearby small bowel identified. This may represent sequelae of a small anastomotic leak that has walled off as there has been interval near resolution of the previously noted extraluminal gas. A follow-up CT after administration of rectal contrast may provide more information to exclude leak if clinically warranted.  Hepatic steatosis.  Original Report Authenticated By: Waneta Martins, M.D.     Review of Systems  Constitutional: Negative for fever, chills and  diaphoresis.  HENT: Negative for hearing loss, nosebleeds, sore throat, facial swelling, mouth sores and ear discharge.   Eyes: Negative for photophobia, discharge and visual disturbance.  Respiratory: Positive for shortness of breath. Negative for cough, choking, chest tightness, wheezing and stridor.   Cardiovascular: Positive for palpitations. Negative for chest pain and leg swelling.       Patient walks 30 minutes for about 1 mile without difficulty before getting SOB.  No exertional chest/neck/shoulder/arm pain.  Gastrointestinal: Positive for abdominal pain. Negative for nausea, vomiting, diarrhea, constipation, blood in stool, abdominal distention, anal bleeding and rectal pain.  Genitourinary: Negative for dysuria, urgency, difficulty urinating and testicular pain.  Musculoskeletal: Negative for myalgias, back pain, arthralgias and gait problem.  Skin: Negative for color change, pallor, rash and wound.  Neurological: Positive for headaches. Negative for dizziness, speech difficulty, weakness, light-headedness and numbness.  Hematological: Negative for adenopathy. Does not bruise/bleed easily.  Psychiatric/Behavioral: Negative for hallucinations, confusion and agitation.       Objective:   Physical Exam  Constitutional: He is oriented to person, place, and time. He appears well-developed and well-nourished. No distress.  HENT:  Head: Normocephalic.  Mouth/Throat: Oropharynx is clear and moist. No oropharyngeal exudate.  Eyes: Conjunctivae and EOM are normal. Pupils are equal, round, and reactive to light. No scleral icterus.  Neck: Normal range of motion. Neck supple. No tracheal deviation present.  Cardiovascular: Normal rate, regular rhythm and intact distal pulses.   Pulmonary/Chest: Effort normal and breath sounds normal. No respiratory distress.  Abdominal: Soft. He exhibits no distension. There is tenderness in the left lower quadrant. There is guarding. There is no rigidity,  no rebound and negative Murphy's sign. Hernia confirmed negative in the right inguinal area and confirmed negative in the left inguinal area.    Musculoskeletal: Normal range of motion. He exhibits no tenderness.  Lymphadenopathy:    He has no cervical adenopathy.       Right: No inguinal adenopathy present.       Left: No inguinal adenopathy present.  Neurological: He is alert and oriented to person, place, and time. No cranial nerve deficit. He exhibits normal muscle tone. Coordination normal.  Skin: Skin is warm and dry. No rash noted. He is not diaphoretic. No erythema. No pallor.  Psychiatric: He has a normal mood and affect. His behavior is normal. Judgment and thought content normal.   Dg Abd 1 View  07/23/2011  *RADIOLOGY REPORT*  Clinical Data: Abdominal pain.  ABDOMEN - 1 VIEW  Comparison: CT scan 07/15/2011.  Findings: The bowel gas pattern is unremarkable.  No findings for small bowel obstruction.  The soft tissue shadows of  the abdomen maintained.  The lung bases are not included but no obvious free air.  The bony structures are intact.  Moderate degenerative changes involving both hips.  IMPRESSION:  No plain film findings for an acute abdominal process.  Original Report Authenticated By: P. Loralie Champagne, M.D.   Ct Head Wo Contrast  07/23/2011  *RADIOLOGY REPORT*  Clinical Data: Headache, dizziness, history of carcinoma of the colon  CT HEAD WITHOUT CONTRAST  Technique:  Contiguous axial images were obtained from the base of the skull through the vertex without contrast.  Comparison: 11/11/2010 Correlation:  MRI brain 07/01/2011  Findings: Extensive falcine dural calcification again identified. Generalized atrophy. Normal ventricular morphology. No midline shift or mass effect. No definite intracranial hemorrhage, mass lesion, or evidence of acute infarction. No definite extra-axial fluid collections. The small 9 mm parafalcine meningioma identified on prior MR is not distinguishable  from the dural calcifications by CT. Visualized bones and sinuses unremarkable.  IMPRESSION: No acute intracranial abnormalities.  Original Report Authenticated By: Lollie Marrow, M.D.   Ct Abdomen Pelvis W Contrast  07/24/2011  *RADIOLOGY REPORT*  Clinical Data: Generalized weakness, recent colon surgery, questionable leak.  CT ABDOMEN AND PELVIS WITH CONTRAST  Technique:  Multidetector CT imaging of the abdomen and pelvis was performed following the standard protocol during bolus administration of intravenous contrast.  Contrast: OMNIPAQUE IOHEXOL 300 MG/ML  SOLN  Comparison: 07/15/2011 CT performed at [facility]  Findings: Limited images through the lung bases demonstrate no significant appreciable abnormality. The heart size is within normal limits. No pleural or pericardial effusion.  Hepatic steatosis.  Variant enhancement of the posterior segment right hepatic lobe versus area of fatty sparing.  Unremarkable spleen, pancreas, biliary system, adrenal glands.  Symmetric renal enhancement.  No hydronephrosis or hydroureter.  Status post partial colectomy with anastomotic suture noted in the region of the sigmoid colon.  Again noted is adjacent fat stranding and associated tract that extends into the adjacent fat however without definite communication with adjacent bowel.  Decreased extraluminal gas in the interval.  No bowel obstruction.  Decompressed bladder.  No lymphadenopathy.  There is scattered atherosclerotic calcification of the aorta and its branches. No aneurysmal dilatation.  Multilevel degenerative changes of the imaged spine. No acute or aggressive appearing osseous lesion.  IMPRESSION: Surgical anastomosis at the sigmoid colon with adjacent inflammatory change/tract is again noted.  No fluid collection or fistulous communication to the nearby small bowel identified. This may represent sequelae of a small anastomotic leak that has walled off as there has been interval near resolution  of the previously noted extraluminal gas. A follow-up CT after administration of rectal contrast may provide more information to exclude leak if clinically warranted.  Hepatic steatosis.  Original Report Authenticated By: Waneta Martins, M.D.     Assessment:     Over 7 months status post colectomy for stage II colon cancer, node negative. Probable T4 given history of prior perforations and no diverticulitis noted on the pathology report.  Postoperative pain and symptoms. Improving CAT scan but not normal yet. Possible persistent cellulitis/abscess near the anastomosis.     Plan:     CT scan with oral/IV/rectal contrast to rule out a true anastomotic leak.  I see no reason for fecal diversion or percutaneous drainage since it seems to be getting better.  Patient needs a bowel regimen to keep himself more regular. I again gave him handouts on this.  Patient requires a followup colonoscopy a year out from  surgery. January 2014. We will set up with [facility] per his request.  Repeat CEA since 3 months out and had it elevated preoperatively 13.5 to 2.  ?Now  I asked the patient to try and call his oncologist to see if he would benefit from post adjuvant chemotherapy as initially suspected.

## 2011-11-27 NOTE — Telephone Encounter (Signed)
Called and spoke with Kenney Houseman at [facility] who added rectal contrast to CT order per Dr Michaell Cowing.

## 2011-11-27 NOTE — Patient Instructions (Addendum)
Obtain CT scan to make sure abscess has resolved.  Blood work to rule out infection or other possibilities of abdominal pain  You'll need a colonoscopy to followup on your colon cancer and make sure you're not growing any new polyps.  He will help you schedule this for January 2014.  GETTING TO GOOD BOWEL HEALTH. Irregular bowel habits such as constipation and diarrhea can lead to many problems over time.  Having one soft bowel movement a day is the most important way to prevent further problems.  The anorectal canal is designed to handle stretching and feces to safely manage our ability to get rid of solid waste (feces, poop, stool) out of our body.  BUT, hard constipated stools can act like ripping concrete bricks and diarrhea can be a burning fire to this very sensitive area of our body, causing inflamed hemorrhoids, anal fissures, increasing risk is perirectal abscesses, abdominal pain/bloating, an making irritable bowel worse.     The goal: ONE SOFT BOWEL MOVEMENT A DAY!  To have soft, regular bowel movements:    Drink at least 8 tall glasses of water a day.     Take plenty of fiber.  Fiber is the undigested part of plant food that passes into the colon, acting s "natures broom" to encourage bowel motility and movement.  Fiber can absorb and hold large amounts of water. This results in a larger, bulkier stool, which is soft and easier to pass. Work gradually over several weeks up to 6 servings a day of fiber (25g a day even more if needed) in the form of: o Vegetables -- Root (potatoes, carrots, turnips), leafy green (lettuce, salad greens, celery, spinach), or cooked high residue (cabbage, broccoli, etc) o Fruit -- Fresh (unpeeled skin & pulp), Dried (prunes, apricots, cherries, etc ),  or stewed ( applesauce)  o Whole grain breads, pasta, etc (whole wheat)  o Bran cereals    Bulking Agents -- This type of water-retaining fiber generally is easily obtained each day by one of the following:   o Psyllium bran -- The psyllium plant is remarkable because its ground seeds can retain so much water. This product is available as Metamucil, Konsyl, Effersyllium, Per Diem Fiber, or the less expensive generic preparation in drug and health food stores. Although labeled a laxative, it really is not a laxative.  o Methylcellulose -- This is another fiber derived from wood which also retains water. It is available as Citrucel. o Polyethylene Glycol - and "artificial" fiber commonly called Miralax or Glycolax.  It is helpful for people with gassy or bloated feelings with regular fiber o Flax Seed - a less gassy fiber than psyllium   No reading or other relaxing activity while on the toilet. If bowel movements take longer than 5 minutes, you are too constipated   AVOID CONSTIPATION.  High fiber and water intake usually takes care of this.  Sometimes a laxative is needed to stimulate more frequent bowel movements, but    Laxatives are not a good long-term solution as it can wear the colon out. o Osmotics (Milk of Magnesia, Fleets phosphosoda, Magnesium citrate, MiraLax, GoLytely) are safer than  o Stimulants (Senokot, Castor Oil, Dulcolax, Ex Lax)    o Do not take laxatives for more than 7days in a row.    IF SEVERELY CONSTIPATED, try a Bowel Retraining Program: o Do not use laxatives.  o Eat a diet high in roughage, such as bran cereals and leafy vegetables.  o Drink six (  6) ounces of prune or apricot juice each morning.  o Eat two (2) large servings of stewed fruit each day.  o Take one (1) heaping tablespoon of a psyllium-based bulking agent twice a day. Use sugar-free sweetener when possible to avoid excessive calories.  o Eat a normal breakfast.  o Set aside 15 minutes after breakfast to sit on the toilet, but do not strain to have a bowel movement.  o If you do not have a bowel movement by the third day, use an enema and repeat the above steps.    Controlling diarrhea o Switch to liquids and  simpler foods for a few days to avoid stressing your intestines further. o Avoid dairy products (especially milk & ice cream) for a short time.  The intestines often can lose the ability to digest lactose when stressed. o Avoid foods that cause gassiness or bloating.  Typical foods include beans and other legumes, cabbage, broccoli, and dairy foods.  Every person has some sensitivity to other foods, so listen to our body and avoid those foods that trigger problems for you. o Adding fiber (Citrucel, Metamucil, psyllium, Miralax) gradually can help thicken stools by absorbing excess fluid and retrain the intestines to act more normally.  Slowly increase the dose over a few weeks.  Too much fiber too soon can backfire and cause cramping & bloating. o Probiotics (such as active yogurt, Align, etc) may help repopulate the intestines and colon with normal bacteria and calm down a sensitive digestive tract.  Most studies show it to be of mild help, though, and such products can be costly. o Medicines:   Bismuth subsalicylate (ex. Kayopectate, Pepto Bismol) every 30 minutes for up to 6 doses can help control diarrhea.  Avoid if pregnant.   Loperamide (Immodium) can slow down diarrhea.  Start with two tablets (4mg  total) first and then try one tablet every 6 hours.  Avoid if you are having fevers or severe pain.  If you are not better or start feeling worse, stop all medicines and call your doctor for advice o Call your doctor if you are getting worse or not better.  Sometimes further testing (cultures, endoscopy, X-ray studies, bloodwork, etc) may be needed to help diagnose and treat the cause of the diarrhea. o

## 2011-12-01 ENCOUNTER — Other Ambulatory Visit: Payer: Self-pay

## 2011-12-03 ENCOUNTER — Ambulatory Visit
Admission: RE | Admit: 2011-12-03 | Discharge: 2011-12-03 | Disposition: A | Discharge status: Home / Self Care | Payer: Medicaid Other | Source: Ambulatory Visit | Attending: Surgery | Admitting: Surgery

## 2011-12-03 DIAGNOSIS — C187 Malignant neoplasm of sigmoid colon: Secondary | ICD-10-CM

## 2011-12-03 DIAGNOSIS — G8929 Other chronic pain: Secondary | ICD-10-CM

## 2011-12-03 MED ORDER — IOHEXOL 300 MG/ML  SOLN
125.0000 mL | Freq: Once | INTRAMUSCULAR | Status: AC | PRN
  Administered 2011-12-03: 125 mL via INTRAVENOUS

## 2011-12-03 MED ORDER — IOHEXOL 300 MG/ML  SOLN
20.0000 mL | Freq: Once | INTRAMUSCULAR | Status: AC | PRN
  Administered 2011-12-03: 20 mL via ORAL

## 2011-12-04 ENCOUNTER — Telehealth (INDEPENDENT_AMBULATORY_CARE_PROVIDER_SITE_OTHER): Payer: Self-pay

## 2011-12-04 NOTE — Telephone Encounter (Signed)
Called pt to let him know that his labs and CT report were good per Dr Michaell Cowing showing no abscess or leak. The pt said that he had extreme abdominal pain yesterday with chills after doing the CT scan but feeling a little better today. The pt is still having some abdominal pain but no chills. The pt did eat a gravy biscuit this am with no trouble. The pt is moving his bowels with loose stools to watery stools. Pls advise if anything else to tell him about the abdominal pain.

## 2011-12-09 ENCOUNTER — Telehealth (INDEPENDENT_AMBULATORY_CARE_PROVIDER_SITE_OTHER): Payer: Self-pay | Admitting: General Surgery

## 2011-12-09 NOTE — Telephone Encounter (Signed)
Andrew Walls CALLED TO REPORT THAT HE IS STILL EXPERIENCING ABDOMINAL PAIN AS REPORTED LAST WEEK. HE IS REQUESTING ADVICE REGARDING HELP WITH THIS. I ALSO REVIEWED INSTRUCTIONS GIVEN BY DR. Michaell Cowing AT LAST OFFICE VISIT/ GY

## 2011-12-10 ENCOUNTER — Telehealth (INDEPENDENT_AMBULATORY_CARE_PROVIDER_SITE_OTHER): Payer: Self-pay

## 2011-12-10 ENCOUNTER — Telehealth (INDEPENDENT_AMBULATORY_CARE_PROVIDER_SITE_OTHER): Payer: Self-pay | Admitting: General Surgery

## 2011-12-10 NOTE — Telephone Encounter (Signed)
Make sure he is taking a fiber regimen.  Trial of bland diet for 2-3 days  Trial of nonsteroidals (aleve 2 bid), heat qid for 3 weeks  If not better, consider GI consult

## 2011-12-10 NOTE — Telephone Encounter (Signed)
Pt's brother called to clarify orders for [facility].  Went over the orders from Dr. Michaell Cowing to regulate his bowels:  Fiber regimen with Miralax, Aleve 2 tabs Q12H and heating pad to abdomen for comfort.  Brother understands and will facilitate this with pt.

## 2011-12-10 NOTE — Telephone Encounter (Signed)
Pt calling to find out what Dr Michaell Cowing recommended about the pt still having pain. Per Dr Michaell Cowing he wants the pt on a fiber regimen, a trial bland diet for 2-3 days, a course of 2 Aleve BID w/heating pad TID for 3 wks, and if the abdominal pain doesn't get any better the pt would need to see a GI doctor. The pt understands.

## 2011-12-22 ENCOUNTER — Telehealth (INDEPENDENT_AMBULATORY_CARE_PROVIDER_SITE_OTHER): Payer: Self-pay | Admitting: General Surgery

## 2011-12-22 ENCOUNTER — Encounter: Payer: Self-pay | Admitting: Gastroenterology

## 2011-12-22 DIAGNOSIS — R1084 Generalized abdominal pain: Secondary | ICD-10-CM

## 2011-12-22 NOTE — Telephone Encounter (Signed)
Pt in [place] with brother today and called to ask to see Dr. Michaell Cowing this afternoon.  Per last notes, he needs referral to [facility].  Pt states he tried to call for appt with Dr. Lina Sar and was told by her office she required referral from another MD to schedule an appt for him.  Pt states he is still having "severe stomach pain."  Needs referral appt.

## 2011-12-22 NOTE — Telephone Encounter (Signed)
Referral made to [facility]. No appts available with Dr Juanda Chance. 1st available appt is 01/19/12 @ 3:45 pm with Dr Arlyce Dice. Patient has been put on a wait list. I called patient with appt information but he was not somewhere he could write this down. He is going to call back for this information. Patient needs address of  GI as well- [address]  [place], [place] [address]. Awaiting call back.

## 2011-12-29 ENCOUNTER — Telehealth: Payer: Self-pay | Admitting: Gastroenterology

## 2011-12-29 NOTE — Telephone Encounter (Signed)
Pt states he is having abdominal pain on his left side that runs across the bottom of his stomach. Pt also reports that his stools are loose and have turned dark in color. Pt requesting to be seen sooner than scheduled appt. Pt scheduled to see Willette Cluster NP tomorrow morning at 8:30am. Pt aware of appt date and time.

## 2011-12-30 ENCOUNTER — Encounter: Payer: Self-pay | Admitting: Nurse Practitioner

## 2011-12-30 ENCOUNTER — Ambulatory Visit (INDEPENDENT_AMBULATORY_CARE_PROVIDER_SITE_OTHER): Payer: Medicare Other | Admitting: Nurse Practitioner

## 2011-12-30 VITALS — BP 88/60 | HR 84 | Ht 75.5 in | Wt 311.6 lb

## 2011-12-30 DIAGNOSIS — R1032 Left lower quadrant pain: Secondary | ICD-10-CM

## 2011-12-30 DIAGNOSIS — G8929 Other chronic pain: Secondary | ICD-10-CM

## 2011-12-30 DIAGNOSIS — R197 Diarrhea, unspecified: Secondary | ICD-10-CM

## 2011-12-30 MED ORDER — CILIDINIUM-CHLORDIAZEPOXIDE 2.5-5 MG PO CAPS: 1.0000 | ORAL_CAPSULE | Freq: Three times a day (TID) | ORAL | Status: DC

## 2011-12-30 NOTE — Progress Notes (Signed)
12/30/2011 Andrew Walls [id number] 1960-04-18   HISTORY OF PRESENT ILLNESS: Patient is a 51 year old male here with his stepbrother  for evaluation of  abdominal pain. 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 A review of the records shows that a colonoscopy was done by Dr. Erskine Speed (surgeon) in [place].  Within a few days of diagnosis, patient underwent sigmoid colectomy. Patient was referred to oncology, Dr. Ubaldo Glassing. Cancer was staged as T3 N0. Patient states chemotherapy was discussed but postponed when his persistent abdominal pain resulted in a CT scan in March suggesting an anastomotic leak versus abscess. For unclear reasons patient was not referred back to his surgeon Dr. Catalina Gravel, he was referred instead to  Dr. Michaell Cowing at [facility].. Patient saw Dr. Michaell Cowing 08/03/11 but several days prior to that visit patient was seen in the emergency department. CT scan of the abdomen and pelvis with contrast in the ED showed some adjacent inflammatory changes adjacent to the anastomosis but no fluid collections or fistulous communications seen..  Dr. Michaell Cowing  treated the patient with 2 weeks of amoxicillin an effort to facilitate healing. Because of persistent pain, another CT was performed 08/31/11 findings of persistent small contained leak or tiny abscess. Surgery treated him with another round of antibiotics in the form of Cipro and Flagyl for 10 days. Patient did not return to [facility] until 11/27/11 because of lack of funding. He was still having abdominal pain and another CT with oral, IV and rectal contrast was obtained and revealed scar tissue extending from the anastomosis superiorly. No abscesses or extravasation of contrast was seen. There was a single locular gas adjacent to the anastomosis with adjacent gas bubbles. The gas bubbles were surrounded by soft tissue density and contrast. Findings felt to  likely represent tethering of the colonic soft tissues rather than a true perforation. Labs on 11/27/11 include a normal CBC. Comprehensive metabolic profile normal with the exception of serum glucose at 169. CEA level last month was normal at 1.0.   Patient is now here for evaluation of his ongoing abdominal pain. The pain is left lower quadrant, but has begun radiating into the left upper quadrant as well as across to the right lower quadrant over the last few days. Pain is definitely worse with food. Narcotics due ease the pain. Pain not relieved with defecation. Patient gives a history of loose stool present for over a year now. Loose stool dates sigmoid colectomy. Of note C. difficile PCR 08/28/11 was negative.    Past Medical History  Diagnosis Date  . Diabetes mellitus 08/03/2011  . Cancer of sigmoid colon, s/p colectomy [id number], pT3pN0 (0/8 LN) 08/03/2011  . Morbid obesity with BMI of 40.0-44.9, adult 08/03/2011  . Diverticulitis of colon 2006,2011,2012,2013    ?really perforated colon cancer   Past Surgical History  Procedure Date  . Abdominal surgery   . Colon surgery 04/23/2011    left colectomy    reports that he has never smoked. He does not have any smokeless tobacco history on file. He reports that he does not drink alcohol or use illicit drugs. family history includes Cancer in his father and Heart disease in his mother. No Known Allergies    Outpatient Encounter Prescriptions as of 12/30/2011  Medication Sig Dispense Refill  . albuterol (VENTOLIN HFA) 108 (90 BASE) MCG/ACT inhaler Inhale 2 puffs into the lungs every 6 (six) hours as  needed.      . budesonide-formoterol (SYMBICORT) 80-4.5 MCG/ACT inhaler Inhale 2 puffs into the lungs 2 (two) times daily.      . diphenoxylate-atropine (LOMOTIL) 2.5-0.025 MG per tablet Take 1 tablet by mouth 2 (two) times daily.      Marland Kitchen gabapentin (NEURONTIN) 300 MG capsule Take 300 mg by mouth at bedtime.      Marland Kitchen glipiZIDE (GLUCOTROL) 10 MG tablet  Take 10 mg by mouth 2 (two) times daily before a meal.      . ibuprofen (ADVIL,MOTRIN) 200 MG tablet Take 600 mg by mouth as needed. For pain      . metFORMIN (GLUCOPHAGE) 500 MG tablet Take 500 mg by mouth 2 (two) times daily with a meal. 500 mg in the am and 1000mg  at bedtime      . olmesartan-hydrochlorothiazide (BENICAR HCT) 20-12.5 MG per tablet Take 1 tablet by mouth daily.      Marland Kitchen oxyCODONE-acetaminophen (PERCOCET/ROXICET) 5-325 MG per tablet Take 1 tablet by mouth 2 (two) times daily as needed.      . triamcinolone cream (KENALOG) 0.1 % Apply 1 application topically 2 (two) times daily.      Marland Kitchen triamcinolone ointment (KENALOG) 0.1 % Apply 1 application topically 2 (two) times daily.      Marland Kitchen DISCONTD: glipiZIDE (GLUCOTROL) 10 MG tablet Take 10 mg by mouth 2 (two) times daily before a meal.         REVIEW OF SYSTEMS  : Positive for arthritis, back pain, vision changes, confusion, depression, headaches, hearing problems, itching, muscle cramps, night sweats, shortness of breath, skin rash, sleeping problem, swelling of feet, excessive thirst and excessive urination. All other systems reviewed and negative except where noted in the History of Present Illness.   PHYSICAL EXAM: BP 88/60  Pulse 84  Ht 6' 3.5" (1.918 m)  Wt 311 lb 9.6 oz (141.341 kg)  BMI 38.43 kg/m2 General: Morbidly obese white male in no acute distress Head: Normocephalic and atraumatic Eyes:  sclerae anicteric,conjunctive pink. Ears: Normal auditory acuity Neck: Supple, no masses.  Lungs: Clear throughout to auscultation Heart: Regular rate and rhythm Abdomen: Soft, obese, diffuse tenderness, greatest in LLQ and RLQ. Normal bowel sounds Musculoskeletal: Symmetrical with no gross deformities  Skin: No lesions on visible extremities Extremities: No edema  Neurological: Alert oriented x 4, grossly nonfocal Cervical Nodes:  No significant cervical adenopathy Psychological:  Alert and cooperative. Normal mood and  affect  ASSESSMENT AND PLAN:  1. Generalized abdominal pain, worse with meals. His pain has become worse over the last few weeks. Patient has had several CT scans for evaluation of a questionable anastomotic leak versus abscess. Other than these findings, scans been unremarkable. Cause of his ongoing abdominal pain is not clear. Will try librax before meals. Repeat CTscan in 6 weeks for reassessment of inflammatory changes adjacent to colonic anastomosis. 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 Normal CEA 11/27/11.  Patient followed by Dr.Darovsky. See history of present illness.  3. chronic loose stool, C. difficile negative in June.  4. diabetes mellitus  5. morbid obesity   Over 45 minutes was spent reviewing records, talking with patient and stepbrother and discussing case with Dr. Marina Goodell.

## 2011-12-30 NOTE — Patient Instructions (Addendum)
We sent a prescription for Librax to [facility], [place].  Follow up as needed.

## 2011-12-31 ENCOUNTER — Telehealth: Payer: Self-pay | Admitting: *Deleted

## 2011-12-31 NOTE — Telephone Encounter (Signed)
Sent prescription for Librax , take before meals to [facility].

## 2012-01-01 ENCOUNTER — Encounter: Payer: Self-pay | Admitting: Nurse Practitioner

## 2012-01-01 ENCOUNTER — Telehealth: Payer: Self-pay | Admitting: *Deleted

## 2012-01-01 NOTE — Progress Notes (Signed)
I personally saw, interview, and examined the patient. Also outside records reviewed. His left-sided pain is almost certainly related in some way to his surgery. Postoperative imaging was not normal postoperative imaging. His symptoms are certainly not a primary GI disorder. In any event, no indication for surgery. At this point symptomatic therapies. Should he worsen, repeat imaging may be indicated. Discussed with the patient and his family member

## 2012-01-01 NOTE — Telephone Encounter (Signed)
Refill

## 2012-01-06 ENCOUNTER — Telehealth: Payer: Self-pay | Admitting: *Deleted

## 2012-01-06 ENCOUNTER — Other Ambulatory Visit: Payer: Self-pay | Admitting: *Deleted

## 2012-01-06 DIAGNOSIS — K9189 Other postprocedural complications and disorders of digestive system: Secondary | ICD-10-CM

## 2012-01-06 DIAGNOSIS — R1084 Generalized abdominal pain: Secondary | ICD-10-CM

## 2012-01-06 NOTE — Telephone Encounter (Signed)
Per Willette Cluster ACNP ,  Made appointment for patient for a CT Abd & Pelvis on 02-03-2012.  Andrew Walls wanted it 6 weeks out from his office appt with her on 12-30-2011).  I called and advised pt of this appointment .  He had labs for CMET the day of his visit in Sept. But by Nov. Rose from [facility] said he would need labs again.  I put in an order for him to come to the lab for a BMET within the next week . I also asked him to come to our 3rd floor office on the same day and ask for me and I will give him instructions and his contrast.   The reason for the Ct is worsening abd pain and Questionable anastomotic leak versus abscess.

## 2012-01-06 NOTE — Telephone Encounter (Signed)
  You have been scheduled for a CT scan of the abdomen and pelvis at [facility] ([address]---this is in the same building as Architectural technologist).   You are scheduled on 02-03-2012 at 10 Am . You should arrive at 9:45 AM  to your appointment time for registration. Please follow the written instructions below on the day of your exam:  WARNING: IF YOU ARE ALLERGIC TO IODINE/X-RAY DYE, PLEASE NOTIFY RADIOLOGY IMMEDIATELY AT [phone]! YOU WILL BE GIVEN A 13 HOUR PREMEDICATION PREP.  1) Do not eat or drink anything after 6:00 am  (4 hours prior to your test) 2) You have been given 2 bottles of oral contrast to drink. The solution may taste better if refrigerated, but do NOT add ice or any other liquid to this solution. Shake well before drinking.    Drink 1 bottle of contrast @8 :00 AM  (2 hours prior to your exam)  Drink 1 bottle of contrast @ 9:00 AM  (1 hour prior to your exam)  You may take any medications as prescribed with a small amount of water except for the following: Metformin, Glucophage, Glucovance, Avandamet, Riomet, Fortamet, Actoplus Met, Janumet, Glumetza or Metaglip. The above medications must be held the day of the exam AND 48 hours after the exam.  The purpose of you drinking the oral contrast is to aid in the visualization of your intestinal tract. The contrast solution may cause some diarrhea. Before your exam is started, you will be given a small amount of fluid to drink. Depending on your individual set of symptoms, you may also receive an intravenous injection of x-ray contrast/dye. Plan on being at [facility] for 30 minutes or long, depending on the type of exam you are having performed.  If you have any questions regarding your exam or if you need to reschedule, you may call the CT department at [phone] between the hours of 8:00 am and 5:00 pm, Monday-Friday.  ________________________________________________________________________

## 2012-01-19 ENCOUNTER — Ambulatory Visit: Payer: Medicaid Other | Admitting: Gastroenterology

## 2012-02-01 ENCOUNTER — Other Ambulatory Visit (INDEPENDENT_AMBULATORY_CARE_PROVIDER_SITE_OTHER): Payer: Medicare Other

## 2012-02-01 ENCOUNTER — Telehealth: Payer: Self-pay | Admitting: *Deleted

## 2012-02-01 ENCOUNTER — Other Ambulatory Visit: Payer: Self-pay | Admitting: *Deleted

## 2012-02-01 DIAGNOSIS — K929 Disease of digestive system, unspecified: Secondary | ICD-10-CM

## 2012-02-01 DIAGNOSIS — R1084 Generalized abdominal pain: Secondary | ICD-10-CM

## 2012-02-01 DIAGNOSIS — R52 Pain, unspecified: Secondary | ICD-10-CM

## 2012-02-01 DIAGNOSIS — K9189 Other postprocedural complications and disorders of digestive system: Secondary | ICD-10-CM

## 2012-02-01 LAB — BASIC METABOLIC PANEL
CO2: 24 mEq/L (ref 19–32)
Chloride: 106 mEq/L (ref 96–112)
Glucose, Bld: 194 mg/dL — ABNORMAL HIGH (ref 70–99)
Potassium: 4.1 mEq/L (ref 3.5–5.1)
Sodium: 140 mEq/L (ref 135–145)

## 2012-02-01 NOTE — Telephone Encounter (Signed)
The patient did come to our lab today for a BMET .  He then came to our 3rd floor and I gave him the contrast and his instructions for the CT scan scheduled at our [facility] [address] I read and went over the instructions with the patient. The CT scan is scheduled for Wed 02-03-2012. He is to arrive at 9:59m.  I explained he is to drink the contrast , the 1st bottle at 8AM and the 2nd bottle at 9am.

## 2012-02-03 ENCOUNTER — Ambulatory Visit (INDEPENDENT_AMBULATORY_CARE_PROVIDER_SITE_OTHER)
Admission: RE | Admit: 2012-02-03 | Discharge: 2012-02-03 | Disposition: A | Discharge status: Home / Self Care | Payer: Medicare Other | Source: Ambulatory Visit | Attending: Nurse Practitioner | Admitting: Nurse Practitioner

## 2012-02-03 DIAGNOSIS — R1084 Generalized abdominal pain: Secondary | ICD-10-CM

## 2012-02-03 DIAGNOSIS — R52 Pain, unspecified: Secondary | ICD-10-CM

## 2012-02-03 DIAGNOSIS — K9189 Other postprocedural complications and disorders of digestive system: Secondary | ICD-10-CM

## 2012-02-03 MED ORDER — IOHEXOL 300 MG/ML  SOLN
100.0000 mL | Freq: Once | INTRAMUSCULAR | Status: AC | PRN
  Administered 2012-02-03: 100 mL via INTRAVENOUS

## 2012-05-24 ENCOUNTER — Encounter: Payer: Medicare Other | Admitting: Internal Medicine

## 2012-05-24 DIAGNOSIS — C189 Malignant neoplasm of colon, unspecified: Secondary | ICD-10-CM

## 2012-05-24 DIAGNOSIS — R109 Unspecified abdominal pain: Secondary | ICD-10-CM

## 2012-05-30 ENCOUNTER — Telehealth: Payer: Self-pay | Admitting: Gastroenterology

## 2012-05-30 NOTE — Telephone Encounter (Signed)
Patient states his oncologist recommended that he have a colonoscopy and a CT scan because his "cancer is coming back".  Patient was offered an appt for tomorrow or one day this week with the extender, but he declines.  He wants to see a MD.  I have asked him to have the oncologist send the records here, or he can bring them to the appt.  He is scheduled for 06/23/12 10:45

## 2012-06-08 ENCOUNTER — Telehealth: Payer: Self-pay | Admitting: Internal Medicine

## 2012-06-08 NOTE — Telephone Encounter (Signed)
I agree with pt seeing an extender in am.

## 2012-06-08 NOTE — Telephone Encounter (Signed)
Patient calling to report diarrhea when he eats that is "orange color" per patient. States his stomach is hurting nonstop. He is not sure if he has a fever but reports chills. Offered patient appointment today with extender but he states he cannot come in today. Scheduled with Willette Cluster, NP on 06/09/12 at 9:00 AM. Patient understands to go to ED if needed. Hx sigmoid cancer Jan. 2013 with sigmoid colectomy. Dr. Marina Goodell GI MD out of office. DOD- Dr. Juanda Chance.

## 2012-06-09 ENCOUNTER — Encounter (HOSPITAL_COMMUNITY): Payer: Self-pay | Admitting: *Deleted

## 2012-06-09 ENCOUNTER — Encounter: Payer: Self-pay | Admitting: Nurse Practitioner

## 2012-06-09 ENCOUNTER — Emergency Department (HOSPITAL_COMMUNITY): Payer: Medicare Other

## 2012-06-09 ENCOUNTER — Other Ambulatory Visit: Payer: Medicare Other

## 2012-06-09 ENCOUNTER — Emergency Department (HOSPITAL_COMMUNITY)
Admission: EM | Admit: 2012-06-09 | Discharge: 2012-06-09 | Disposition: A | Discharge status: Home / Self Care | Payer: Medicare Other | Attending: Emergency Medicine | Admitting: Emergency Medicine

## 2012-06-09 ENCOUNTER — Ambulatory Visit (INDEPENDENT_AMBULATORY_CARE_PROVIDER_SITE_OTHER): Payer: Medicare Other | Admitting: Nurse Practitioner

## 2012-06-09 VITALS — BP 100/70 | HR 66 | Ht 74.5 in | Wt 322.6 lb

## 2012-06-09 DIAGNOSIS — E119 Type 2 diabetes mellitus without complications: Secondary | ICD-10-CM | POA: Insufficient documentation

## 2012-06-09 DIAGNOSIS — R197 Diarrhea, unspecified: Secondary | ICD-10-CM

## 2012-06-09 DIAGNOSIS — Z6841 Body Mass Index (BMI) 40.0 and over, adult: Secondary | ICD-10-CM | POA: Insufficient documentation

## 2012-06-09 DIAGNOSIS — G8929 Other chronic pain: Secondary | ICD-10-CM

## 2012-06-09 DIAGNOSIS — Z9889 Other specified postprocedural states: Secondary | ICD-10-CM | POA: Insufficient documentation

## 2012-06-09 DIAGNOSIS — R1032 Left lower quadrant pain: Secondary | ICD-10-CM | POA: Insufficient documentation

## 2012-06-09 DIAGNOSIS — Z85038 Personal history of other malignant neoplasm of large intestine: Secondary | ICD-10-CM | POA: Insufficient documentation

## 2012-06-09 DIAGNOSIS — Z8719 Personal history of other diseases of the digestive system: Secondary | ICD-10-CM | POA: Insufficient documentation

## 2012-06-09 DIAGNOSIS — Z79899 Other long term (current) drug therapy: Secondary | ICD-10-CM | POA: Insufficient documentation

## 2012-06-09 DIAGNOSIS — IMO0002 Reserved for concepts with insufficient information to code with codable children: Secondary | ICD-10-CM | POA: Insufficient documentation

## 2012-06-09 DIAGNOSIS — R55 Syncope and collapse: Secondary | ICD-10-CM | POA: Insufficient documentation

## 2012-06-09 DIAGNOSIS — K5289 Other specified noninfective gastroenteritis and colitis: Secondary | ICD-10-CM | POA: Insufficient documentation

## 2012-06-09 LAB — GLUCOSE, CAPILLARY: Glucose-Capillary: 179 mg/dL — ABNORMAL HIGH (ref 70–99)

## 2012-06-09 LAB — COMPREHENSIVE METABOLIC PANEL
ALT: 24 U/L (ref 0–53)
Alkaline Phosphatase: 73 U/L (ref 39–117)
BUN: 14 mg/dL (ref 6–23)
CO2: 19 mEq/L (ref 19–32)
Calcium: 8.6 mg/dL (ref 8.4–10.5)
GFR calc Af Amer: 63 mL/min — ABNORMAL LOW (ref >90)
GFR calc non Af Amer: 54 mL/min — ABNORMAL LOW (ref >90)
Glucose, Bld: 192 mg/dL — ABNORMAL HIGH (ref 70–99)
Total Protein: 7.4 g/dL (ref 6.0–8.3)

## 2012-06-09 LAB — URINALYSIS, ROUTINE W REFLEX MICROSCOPIC
Hgb urine dipstick: NEGATIVE
Nitrite: NEGATIVE
Protein, ur: NEGATIVE mg/dL
Specific Gravity, Urine: >1.046 — ABNORMAL HIGH (ref 1.005–1.030)
Urobilinogen, UA: 0.2 mg/dL (ref 0.0–1.0)

## 2012-06-09 LAB — CBC WITH DIFFERENTIAL/PLATELET
Eosinophils Absolute: 0.1 10*3/uL (ref 0.0–0.7)
Eosinophils Relative: 2 % (ref 0–5)
HCT: 38.3 % — ABNORMAL LOW (ref 39.0–52.0)
Hemoglobin: 13 g/dL (ref 13.0–17.0)
Lymphocytes Relative: 16 % (ref 12–46)
Lymphs Abs: 1.1 10*3/uL (ref 0.7–4.0)
MCH: 30 pg (ref 26.0–34.0)
MCV: 88.5 fL (ref 78.0–100.0)
Monocytes Relative: 13 % — ABNORMAL HIGH (ref 3–12)
RBC: 4.33 MIL/uL (ref 4.22–5.81)
WBC: 6.6 10*3/uL (ref 4.0–10.5)

## 2012-06-09 MED ORDER — HYDROMORPHONE HCL PF 1 MG/ML IJ SOLN
1.0000 mg | Freq: Once | INTRAMUSCULAR | Status: AC
  Administered 2012-06-09: 1 mg via INTRAVENOUS
  Filled 2012-06-09: qty 1

## 2012-06-09 MED ORDER — MOVIPREP 100 G PO SOLR: 1.0000 | Freq: Once | ORAL | Status: DC

## 2012-06-09 MED ORDER — SODIUM CHLORIDE 0.9 % IV BOLUS (SEPSIS)
1000.0000 mL | Freq: Once | INTRAVENOUS | Status: AC
  Administered 2012-06-09: 1000 mL via INTRAVENOUS

## 2012-06-09 MED ORDER — IOHEXOL 300 MG/ML  SOLN
100.0000 mL | Freq: Once | INTRAMUSCULAR | Status: AC | PRN
  Administered 2012-06-09: 100 mL via INTRAVENOUS

## 2012-06-09 MED ORDER — IOHEXOL 300 MG/ML  SOLN
50.0000 mL | Freq: Once | INTRAMUSCULAR | Status: AC | PRN
  Administered 2012-06-09: 50 mL via ORAL

## 2012-06-09 MED ORDER — OXYCODONE-ACETAMINOPHEN 5-325 MG PO TABS: 1.0000 | ORAL_TABLET | Freq: Four times a day (QID) | ORAL | Status: DC | PRN

## 2012-06-09 MED ORDER — ONDANSETRON HCL 4 MG/2ML IJ SOLN
4.0000 mg | Freq: Once | INTRAMUSCULAR | Status: AC
  Administered 2012-06-09: 4 mg via INTRAVENOUS
  Filled 2012-06-09: qty 2

## 2012-06-09 MED ORDER — METRONIDAZOLE IN NACL 5-0.79 MG/ML-% IV SOLN
500.0000 mg | Freq: Once | INTRAVENOUS | Status: AC
  Administered 2012-06-09: 500 mg via INTRAVENOUS
  Filled 2012-06-09: qty 100

## 2012-06-09 MED ORDER — METRONIDAZOLE 500 MG PO TABS: 500.0000 mg | ORAL_TABLET | Freq: Three times a day (TID) | ORAL | Status: DC

## 2012-06-09 MED ORDER — CIPROFLOXACIN HCL 500 MG PO TABS: 500.0000 mg | ORAL_TABLET | Freq: Two times a day (BID) | ORAL | Status: DC

## 2012-06-09 MED ORDER — CIPROFLOXACIN IN D5W 400 MG/200ML IV SOLN
400.0000 mg | Freq: Once | INTRAVENOUS | Status: AC
  Administered 2012-06-09: 400 mg via INTRAVENOUS
  Filled 2012-06-09: qty 200

## 2012-06-09 MED ORDER — HYDROCODONE-ACETAMINOPHEN 5-325 MG PO TABS: ORAL_TABLET | ORAL | Status: DC

## 2012-06-09 MED ORDER — ONDANSETRON HCL 4 MG PO TABS: 4.0000 mg | ORAL_TABLET | Freq: Four times a day (QID) | ORAL | Status: DC

## 2012-06-09 NOTE — ED Provider Notes (Signed)
History     CSN: [id number]  Arrival date & time 06/09/12  1116   First MD Initiated Contact with Patient 06/09/12 1305      Chief Complaint  Patient presents with  . Near Syncope  . Abdominal Pain  . Diarrhea    (Consider location/radiation/quality/duration/timing/severity/associated sxs/prior treatment) HPI Pt presents with pain in left lower abdomen.  He has chronic pain in this area but states pain is worse over the past few days.  Also c/o diarrhea which has been liquid in nature which has been ongoing over 1 week.  No fever/chills.  No vomiting.  Has not seen blood or melena in stool.  Was seen at GI today and scheduled for colonoscopy.  He has hx of sigmoid cancer which was treated with resection and he is no longer receiving treatment for this.  Has seen his oncologist this month as well as GI today.  Today at GI after he gave stool sample he became lightheaded and nauseated.  BP was checked and was low so he was advised to come to the ED for further evaluation.  No chest pain, no shortness of breath, no palpitations.  There are no other associated systemic symptoms, there are no other alleviating or modifying factors.  Of note, pt states he was seen at [facility] hospital recently and had CT scan which did not show any abnormalities.    Past Medical History  Diagnosis Date  . Diabetes mellitus 08/03/2011  . Cancer of sigmoid colon, s/p colectomy [id number], pT3pN0 (0/8 LN) 08/03/2011  . Morbid obesity with BMI of 40.0-44.9, adult 08/03/2011  . Diverticulitis of colon 2006,2011,2012,2013    ?really perforated colon cancer  . Steatohepatitis     Past Surgical History  Procedure Laterality Date  . Abdominal surgery    . Colon surgery  04/23/2011    left colectomy    Family History  Problem Relation Age of Onset  . Heart disease Mother   . Cancer Father     melanoma  . Heart disease Maternal Grandmother   . Colon cancer Neg Hx     History  Substance Use Topics  . Smoking  status: Never Smoker   . Smokeless tobacco: Never Used  . Alcohol Use: No      Review of Systems ROS reviewed and all otherwise negative except for mentioned in HPI  Allergies  Review of patient's allergies indicates no known allergies.  Home Medications   Current Outpatient Rx  Name  Route  Sig  Dispense  Refill  . albuterol (VENTOLIN HFA) 108 (90 BASE) MCG/ACT inhaler   Inhalation   Inhale 2 puffs into the lungs every 6 (six) hours as needed.         . budesonide-formoterol (SYMBICORT) 80-4.5 MCG/ACT inhaler   Inhalation   Inhale 2 puffs into the lungs 2 (two) times daily.         . diclofenac (VOLTAREN) 75 MG EC tablet   Oral   Take 75 mg by mouth as needed.         . gabapentin (NEURONTIN) 400 MG capsule   Oral   Take 400 mg by mouth at bedtime.         Marland Kitchen glipiZIDE (GLUCOTROL) 10 MG tablet   Oral   Take 10 mg by mouth 2 (two) times daily before a meal.         . HYDROcodone-acetaminophen (NORCO/VICODIN) 5-325 MG per tablet      Take 1-2 tab every 8 hours as  needed for pain. Do not drive on this medication.   30 tablet   0   . ibuprofen (ADVIL,MOTRIN) 200 MG tablet   Oral   Take 600 mg by mouth as needed (pain). For pain         . Insulin Lispro, Human, (HUMALOG KWIKPEN [place])   Subcutaneous   Inject into the skin as needed.         . irbesartan (AVAPRO) 75 MG tablet   Oral   Take 75 mg by mouth daily.         . meclizine (ANTIVERT) 25 MG tablet   Oral   Take 25 mg by mouth 3 (three) times daily as needed for dizziness.         . metFORMIN (GLUCOPHAGE) 500 MG tablet   Oral   Take 500 mg by mouth 2 (two) times daily with a meal. 500 mg in the am and 1000mg  at bedtime         . OVER THE COUNTER MEDICATION      Centirize hydrochloride 10 mg,  1 tablet every AM for allergy         . promethazine (PHENERGAN) 25 MG tablet   Oral   Take 25 mg by mouth every 6 (six) hours as needed for nausea.         . temazepam (RESTORIL) 30 MG  capsule   Oral   Take 30 mg by mouth at bedtime.         . triamcinolone cream (KENALOG) 0.1 %   Topical   Apply 1 application topically 2 (two) times daily.         Marland Kitchen triamcinolone ointment (KENALOG) 0.1 %   Topical   Apply 1 application topically 2 (two) times daily.         . ciprofloxacin (CIPRO) 500 MG tablet   Oral   Take 1 tablet (500 mg total) by mouth 2 (two) times daily.   20 tablet   0   . metroNIDAZOLE (FLAGYL) 500 MG tablet   Oral   Take 1 tablet (500 mg total) by mouth 3 (three) times daily.   30 tablet   0   . MOVIPREP 100 G SOLR   Oral   Take 1 kit (100 g total) by mouth once. "Pharmacist please use BIN: [id number] GROUP: [id number] ID: [id number] Call [phone] for pharmacy questions "Pt will save $10"   1 kit   0     Dispense as written.   . ondansetron (ZOFRAN) 4 MG tablet   Oral   Take 1 tablet (4 mg total) by mouth every 6 (six) hours.   12 tablet   0   . oxyCODONE-acetaminophen (PERCOCET/ROXICET) 5-325 MG per tablet   Oral   Take 1-2 tablets by mouth every 6 (six) hours as needed for pain.   15 tablet   0     BP 128/73  Pulse 86  Temp(Src) 98.4 F (36.9 C) (Oral)  Resp 18  SpO2 96% Vitals reviewed Physical Exam Physical Examination: General appearance - alert, well appearing, and in no distress Mental status - alert, oriented to person, place, and time Eyes - no conjunctival injection, no scleral icterus Mouth - mucous membranes moist, pharynx normal without lesions Chest - clear to auscultation, no wheezes, rales or rhonchi, symmetric air entry Heart - normal rate, regular rhythm, normal S1, S2, no murmurs, rubs, clicks or gallops Abdomen - soft, ttp in left lower abdomen, no gaurding or rebound, nondistended,  no masses or organomegaly Extremities - peripheral pulses normal, no pedal edema, no clubbing or cyanosis Skin - normal coloration and turgor, no rashes  ED Course  Procedures (including critical care  time)  1:57 PM per chart review- stool for cdif PCR was obtained at GI office prior to patient coming to the ED   Date: 06/09/2012  Rate: 111  Rhythm: sinus tachycardia  QRS Axis: normal  Intervals: normal  ST/T Wave abnormalities: nonspecific T wave changes  Conduction Disutrbances:none  Narrative Interpretation:   Old EKG Reviewed: none available   Labs Reviewed  CBC WITH DIFFERENTIAL - Abnormal; Notable for the following:    HCT 38.3 (*)    Monocytes Relative 13 (*)    All other components within normal limits  COMPREHENSIVE METABOLIC PANEL - Abnormal; Notable for the following:    Glucose, Bld 192 (*)    Creatinine, Ser 1.44 (*)    Albumin 3.3 (*)    GFR calc non Af Amer 54 (*)    GFR calc Af Amer 63 (*)    All other components within normal limits  GLUCOSE, CAPILLARY - Abnormal; Notable for the following:    Glucose-Capillary 179 (*)    All other components within normal limits  URINALYSIS, ROUTINE W REFLEX MICROSCOPIC - Abnormal; Notable for the following:    Color, Urine AMBER (*)    Specific Gravity, Urine >1.046 (*)    Bilirubin Urine SMALL (*)    Ketones, ur 15 (*)    All other components within normal limits  STOOL CULTURE   Ct Abdomen Pelvis W Contrast  06/09/2012  *RADIOLOGY REPORT*  Clinical Data: Left lower abdominal pain and diarrhea.  History of colon resection for cancer.  CT ABDOMEN AND PELVIS WITH CONTRAST  Technique:  Multidetector CT imaging of the abdomen and pelvis was performed following the standard protocol during bolus administration of intravenous contrast.  Contrast: OMNIPAQUE IOHEXOL 300 MG/ML  SOLN  Comparison: 04/19/2012 and 08/31/2011  Findings: There are small patchy densities within the right lower lobe.  Largest measures 6 mm on sequence #7, image 18. These nodules are new since 08/31/2011 but were present on the exam from 04/19/2012.  The lung densities are actually less conspicuous compared to most recent study suggesting that these  could be postinflammatory.  There is also less patchy disease in the left lower lobe compared to the recent comparison examination.  Slightly prominent right subcarinal soft tissue measuring 1.1 cm in short axis on sequence two, image #1.  No evidence for free intraperitoneal air.  Diffuse low density of the liver is suggestive for hepatic steatosis.  There is no focal abnormality within the liver.   Mild distention of the gallbladder without surrounding inflammatory changes.  Normal appearance of the spleen, pancreas and adrenal glands.  Normal appearance of both kidneys.  Evidence for a small exophytic cortical cyst in the left kidney mid pole. There is no significant free fluid.  There is no bulky lymphadenopathy but there has been enlargement of lymph nodes near the aortic bifurcation.  Small conglomeration of lymph nodes are seen on sequence #2, image 67.  Maximal diameter of the lymph node measures 1 cm and previously measured 7 mm on 02/03/2012.  Bowel anastomosis in the distal colon.  There are very small lymph nodes in the left abdomen on image 82 that have progressed since 2013.   Mild haziness and stranding around the left colon but this is not clearly represent acute inflammation.  A subtle  area of colitis is difficult to exclude.  A few small lymph nodes along the descending colon on image 57.  Urinary bladder is grossly normal.  Few small calcifications in the prostate gland.  Right inguinal hernia containing fat.  There is laxity along the anterior abdominal wall near the umbilicus probably related to previous surgery.  Lucency in the proximal left femur is most likely secondary to a previous intramedullary nail.  No acute or suspicious bony abnormality.   IMPRESSION:  There has been enlargement of lymph node just below the aortic bifurcation.  This has clearly progressed since 2013.  There are very small mesenteric lymph nodes in the left lower abdomen and left pericolonic region.  Although these  findings could be inflammatory, they are concerning for neoplasm and recurrent colon cancer.  Consider close surveillance or PET CT to further evaluate these areas.  Mild haziness or stranding around the left colon.  A subtle area of colitis cannot be excluded.  Prominent right subcarinal tissue measuring 1.1 cm in the short axis.  This appears more prominent than on the CT from 12/03/2011. These areas could be further evaluated with a chest CT.  Postsurgical changes in the colon.  Hepatic steatosis.  Decreased patchy densities at the lung bases may represent postinflammatory changes.  Recommend continued attention this on followup imaging.  These results were called by telephone on 06/09/2012 at 04:22 pm to Dr. Karma Ganja, who verbally acknowledged these results.   Original Report Authenticated By: Richarda Overlie, M.D.      1. Colitis   2. Near syncope       MDM  Pt presenting with c/o chronic left lower abdominal pain that has become worse.  Also has had diarrhea for 2 weeks.  He states today at GI office after giving stool sample he felt nauseated, lightheaded and had near syncopal event.  Labs reassuring, cdif stool sample had been collected at GI office.  Pt given IV hydration, EKG reassuring.  CT scan due to left lower abdominal pain.  Read as mild colitis- will start on cipro and flagyl.  Lymph nodes that could be reactive or possibly cancer re-occurrence. Pt has colonoscopy scheduled for April 2nd by GI.  Results discussed with him, will discharge to f/u with oncology and GI, given cipro/flagyl.  Discharged with strict return precautions.  Pt agreeable with plan.        Ethelda Chick, MD 06/10/12 [phone]

## 2012-06-09 NOTE — Progress Notes (Signed)
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 Erskine Speed (surgeon) in [place]). Within a few days of diagnosis, patient underwent sigmoid colectomy for T3 N0 cancer. A CT scan in March 2013 suggested an anastomotic leak versus abscess for which patient was subsequently seen in [facility] by Dr. Michaell Cowing. Patient was treated with 2 weeks of amoxicillin. Because of persistent pain, another CT was performed 08/31/11 with findings of persistent small contained leak or tiny abscess. Surgery treated him with another round of antibiotics. Because of finances patient did not return to [facility] until 11/27/11. For persistent abdominal pain another CT with oral, IV and rectal contrast was obtained. No abscesses or extravasation of contrast was seen. There was a single locular gas adjacent to the anastomosis with adjacent gas bubbles. The gas bubbles were surrounded by soft tissue density and contrast. Findings felt to likely represent tethering of the colonic soft tissues rather than a true perforation. At that point patient came to see [place] for evaluation of ongoing abdominal pain. Patient saw myself and Dr. Marina Goodell 12/29/12. His abdominal pain was not felt to felt GI in nature though we did recommend repeat CTscan in 6 weeks for reassessment of colonic findings on previous CTscan. Patient followed through and had the CTscan 02/03/12. Findings included only stable scarring at site of sigmoid resection. Patient has not been seen here since.    Patient In ED at [facility] in mid January 2014 for pneumonia versus bronchitis. Following that he was hospitalized overnight at [facility] after what sounds like a syncopal episode. Patient was rehospitalized late January for abdominal pain. He was seen by surgery during that admission for evaluation of abdominal pain. Pain not felt to be related  to known abdominal hernia. Noncontrast CT of the abdomen and pelvis negative for metastatic disease.  Patient had outpatient follow up appointment with oncologist (Dr. Ubaldo Glassing) 05/24/12. Labs unrmarkable. Plans was for repeat CEA in 2 months and follow up appointment in 6 months.   Patient made appointment with [place] for worsening abdominal pain and acute on chronic diarrhea. Pain constant. It is in predominantly LLQ but hurts across the whole lower abdomen at times. Eating exacerbates the pain. This is really no different than his chronic abdominal pain , just worse. He complains of diarrhea over the last 1-2 weeks. Typically BMs "run right through" him after eating. Over the last week he has had diarrhea independently of eating. He complains of feeling cold, hasn't checked temp.   Current Medications, Allergies, Past Medical History, Past Surgical History, Family History and Social History were reviewed in [employer] record.   Physical Exam: General: Obese white male in no acute distress Head: Normocephalic and atraumatic Eyes:  sclerae anicteric, conjunctiva pink  Ears: Normal auditory acuity Lungs: Clear throughout to auscultation Heart: Regular rate and rhythm Abdomen: Soft, obese, moderate LLQ tenderness. Mild RLQ tenderness. Difficult to assess for masses given body habitus.  Musculoskeletal: Symmetrical with no gross deformities  Extremities: No edema  Neurological: Alert oriented x 4, grossly nonfocal Psychological:  Alert and cooperative. Normal mood and affect  Assessment and Recommendations:  1.History of sigmoid cancer, s/p resection January 2013. No chemoxrt done. Patient is over a year out since resection. Will schedule him for surveillance colonoscopy with Dr. Marina Goodell. The risks, benefits, and alternatives to colonoscopy with possible biopsy and possible polypectomy were  discussed with the patient and he consents to proceed.    2.Renal insufficiency.  Creatinine up slightly on 3/4/ labs from [place].   3. Acute on chronic diarrhea. He had antibiotics in January. Rule out C-diff. Will check PCR today..  4. Acute on chronic lower abdominal pain, unclear etiology. Await C-diff studies. Non-contrast CTscan of abd/pelvis at [facility] 3/4 was nondiagnostic. May need CTscan with contrast depending on clinical course. He is out of pain medications. Will refill #30 Hydrocodone.   Addendum: following visit patient complained on feeling lightheaded, faint, and nauseated. His vitals were stable. He was alert and oriented but began to stutter his words. Patient taken downstairs in wheelchair. Brother advised to have him evaluated at ED.

## 2012-06-09 NOTE — ED Notes (Signed)
Pt sent from PCP with reports of feeling presyncopal and BP dropped so sent to ED for further evaluation. Pt endorses hx of colon cancer. Pt reports nausea and diarrhea for about 2 weeks.

## 2012-06-09 NOTE — Patient Instructions (Addendum)
We faxed the Hydrocodone to your pharmacy along with the Moviprep for the colonoscopy. We will call you with the results from the stool study. We need to see the results of the stool test before we prescribe anything for the diarrhea.  You have been scheduled for a colonoscopy with propofol. Please follow written instructions given to you at your visit today.  Please pick up your prep kit at the pharmacy within the next 1-3 days. If you use inhalers (even only as needed), please bring them with you on the day of your procedure.

## 2012-06-09 NOTE — ED Notes (Signed)
Patient transported to CT 

## 2012-06-12 NOTE — Progress Notes (Signed)
Agree with initial assessment and plans. Source of pain remains unclear, and not obviously "GI". Needs follow up colonoscopy, so I agree. Would not prescribe hydrocodone beyond this visit, without clear reason for pain, that requires hydrocodone, found.

## 2012-06-13 ENCOUNTER — Telehealth: Payer: Self-pay | Admitting: Nurse Practitioner

## 2012-06-13 MED ORDER — DIPHENOXYLATE-ATROPINE 2.5-0.025 MG PO TABS: ORAL_TABLET | ORAL | Status: DC

## 2012-06-13 NOTE — Telephone Encounter (Signed)
Per Willette Cluster, NP, stop Cipro. Continue Flagyl. Instead of Imodium, Lomotil 1 tablet 3-4 times/day. Stop Lomotil 2 days prior to colonoscopy. Patient notified and rx sent.

## 2012-06-13 NOTE — Telephone Encounter (Signed)
Patient callling to report uncontrolled diarrhea this AM. States he has messed up his bed 3 times. Reports 7-8 watery uncontrolled diarrhea stools since 5 AM. Denies bleeding or fever. Reports left side abdominal pain.also. Reports he is taking Imodium 2 tabs TID without relief. Left message for Willette Cluster, NP to call me.

## 2012-06-22 ENCOUNTER — Ambulatory Visit (AMBULATORY_SURGERY_CENTER): Payer: Medicare Other | Admitting: Internal Medicine

## 2012-06-22 ENCOUNTER — Other Ambulatory Visit: Payer: Self-pay | Admitting: Internal Medicine

## 2012-06-22 ENCOUNTER — Encounter: Payer: Self-pay | Admitting: Internal Medicine

## 2012-06-22 VITALS — BP 151/65 | HR 78 | Temp 97.8°F | Resp 20 | Ht 74.0 in | Wt 322.0 lb

## 2012-06-22 DIAGNOSIS — K5289 Other specified noninfective gastroenteritis and colitis: Secondary | ICD-10-CM

## 2012-06-22 DIAGNOSIS — Z1211 Encounter for screening for malignant neoplasm of colon: Secondary | ICD-10-CM

## 2012-06-22 DIAGNOSIS — R1084 Generalized abdominal pain: Secondary | ICD-10-CM

## 2012-06-22 DIAGNOSIS — K5190 Ulcerative colitis, unspecified, without complications: Secondary | ICD-10-CM

## 2012-06-22 DIAGNOSIS — Z85038 Personal history of other malignant neoplasm of large intestine: Secondary | ICD-10-CM

## 2012-06-22 LAB — GLUCOSE, CAPILLARY: Glucose-Capillary: 171 mg/dL — ABNORMAL HIGH (ref 70–99)

## 2012-06-22 MED ORDER — SODIUM CHLORIDE 0.9 % IV SOLN: 500.0000 mL | INTRAVENOUS | Status: DC

## 2012-06-22 NOTE — Progress Notes (Addendum)
When pt got up to get dressed there was 2 medium sized spots of blood on the pt pad, advised Dr. Marina Goodell, Dr. Marina Goodell stated it is to be expected  Because of the biopsies they took and he may see some through the day, advised pt of information and advised he to call if he starts to see more blood or has pain-adm

## 2012-06-22 NOTE — Op Note (Signed)
[facility] [address] [place], [address]   COLONOSCOPY PROCEDURE REPORT  PATIENT: Andrew Walls, Andrew Walls  MR#: [id number] BIRTHDATE: Jun 02, 1960 , 51  yrs. old GENDER: Male ENDOSCOPIST: Roxy Cedar, MD REFERRED XB:JYNWGN Gross, M.D. PROCEDURE DATE:  06/22/2012 PROCEDURE:   Colonoscopy with biopsies ASA CLASS:   Class II INDICATIONS:High risk patient with personal history of colon cancer (sigmoid resection elsewhere 03-2011 - T3N0) and unexplained diarrhea.   Recent CT for chronic LLQ pain ? pathologic lymph nodes MEDICATIONS: MAC sedation, administered by CRNA and propofol (Diprivan) 300mg  IV  DESCRIPTION OF PROCEDURE:   After the risks benefits and alternatives of the procedure were thoroughly explained, informed consent was obtained.  A digital rectal exam revealed no abnormalities of the rectum.   The LB [id number]  endoscope was introduced through the anus and advanced to the cecum, which was identified by both the appendix and ileocecal valve. No adverse events experienced.   The quality of the prep was excellent, using MoviPrep  The instrument was then slowly withdrawn as the colon was fully examined.      COLON FINDINGS: The mucosa appeared normal in the terminal ileum. The anastomosis at 20cm was patent. There was diffuse colitis throughout suggestive of IBD. Bx taken.  Retroflexed views revealed no abnormalities. The time to cecum=2 minutes 53 seconds. Withdrawal time=10 minutes 0 seconds.  The scope was withdrawn and the procedure completed.  COMPLICATIONS: There were no complications.  ENDOSCOPIC IMPRESSION: 1 . Normal mucosa in the terminal ileum 2.  Diffuse colitis. Looks like IBD 3.  No polyps or cancer  RECOMMENDATIONS: 1.  Await biopsy results 2.  Call office for follow-up appointment with Dr Marina Goodell in 2 weeks 3.  Keep your oncology follow up appointment as scheduled   eSigned:  Roxy Cedar, MD 06/22/2012 8:57 AM cc:  Werner Lean, MD, Karie Soda, MD, and The Patient   PATIENT NAME:  Andrew Walls, Andrew Walls MR#: [id number]

## 2012-06-22 NOTE — Progress Notes (Signed)
Patient did not experience any of the following events: a burn prior to discharge; a fall within the facility; wrong site/side/patient/procedure/implant event; or a hospital transfer or hospital admission upon discharge from the facility. (G8907) Patient did not have preoperative order for IV antibiotic SSI prophylaxis. (G8918)  

## 2012-06-22 NOTE — Progress Notes (Signed)
Called to room to assist during endoscopic procedure.  Patient ID and intended procedure confirmed with present staff. Received instructions for my participation in the procedure from the performing physician.  

## 2012-06-22 NOTE — Patient Instructions (Addendum)

## 2012-06-22 NOTE — Progress Notes (Signed)
No egg or soy allergy. emw 

## 2012-06-23 ENCOUNTER — Telehealth: Payer: Self-pay | Admitting: *Deleted

## 2012-06-23 ENCOUNTER — Ambulatory Visit: Payer: Medicare Other | Admitting: Internal Medicine

## 2012-06-23 NOTE — Telephone Encounter (Signed)
  Follow up Call-  Call back number 06/22/2012  Post procedure Call Back phone  # [phone]  Permission to leave phone message Yes     Patient questions:  Do you have a fever, pain , or abdominal swelling? yes Pain Score  4*  Have you tolerated food without any problems? yes  Have you been able to return to your normal activities? yes  Do you have any questions about your discharge instructions: Diet   no Medications  no Follow up visit  no  Do you have questions or concerns about your Care? no  Actions: * If pain score is 4 or above: No action needed, pain <4.  Pain is 4 out of 10 but pt was having pain before he came in, pt states this is the same pain he was having before he came in, advised pt to continue to take is pain medication and call back if anything changed

## 2012-06-28 ENCOUNTER — Telehealth: Payer: Self-pay

## 2012-06-28 ENCOUNTER — Encounter: Payer: Self-pay | Admitting: Internal Medicine

## 2012-06-28 DIAGNOSIS — C159 Malignant neoplasm of esophagus, unspecified: Secondary | ICD-10-CM

## 2012-06-28 MED ORDER — MESALAMINE 1.2 G PO TBEC: 2.4000 g | DELAYED_RELEASE_TABLET | Freq: Two times a day (BID) | ORAL | Status: DC

## 2012-06-28 NOTE — Telephone Encounter (Signed)
Andrew Walls, This patient was recently diagnosed with ulcerative colitis. I received the biopsies today. I would like to start him on mesalamine. He has Medicare, I'm not sure which is the most cost effective. Options I would like to prescribe are either Asacol 400 mg, Asacol HD 800 mg, or Lialda 1.2 g. The total dosage of any of the should be 2400 mg twice daily. Contact the patient in this regard.. Let me know what we will prescribe. Thanks   Spoke with pt and he is aware. Script sent to pharmacy for Lialda 2.4gm BID, copay with his insurance should be a little over 3.00 for the script. Dr. Marina Goodell aware.

## 2012-06-28 NOTE — Telephone Encounter (Signed)
Thanks

## 2012-07-11 ENCOUNTER — Telehealth: Payer: Self-pay | Admitting: Internal Medicine

## 2012-07-11 NOTE — Telephone Encounter (Signed)
Pt states he has been having diarrhea for several days and it has gotten worse. States the last 2 BMs he had today had bright red/orange blood in them that colored the toilet bowl. Pt scheduled to see Doug Sou PA tomorrow at 9:15am. Pt aware of appt date and time. Pt knows to go to the ER if the bleeding gets worse.

## 2012-07-12 ENCOUNTER — Ambulatory Visit: Payer: Medicare Other | Admitting: Gastroenterology

## 2012-07-15 ENCOUNTER — Encounter: Payer: Self-pay | Admitting: Internal Medicine

## 2012-07-15 ENCOUNTER — Ambulatory Visit (INDEPENDENT_AMBULATORY_CARE_PROVIDER_SITE_OTHER): Payer: Medicare Other | Admitting: Internal Medicine

## 2012-07-15 VITALS — BP 114/70 | HR 72 | Ht 74.0 in | Wt 319.8 lb

## 2012-07-15 DIAGNOSIS — E1165 Type 2 diabetes mellitus with hyperglycemia: Secondary | ICD-10-CM

## 2012-07-15 DIAGNOSIS — Z85038 Personal history of other malignant neoplasm of large intestine: Secondary | ICD-10-CM

## 2012-07-15 DIAGNOSIS — R1032 Left lower quadrant pain: Secondary | ICD-10-CM

## 2012-07-15 DIAGNOSIS — K5190 Ulcerative colitis, unspecified, without complications: Secondary | ICD-10-CM

## 2012-07-15 DIAGNOSIS — E118 Type 2 diabetes mellitus with unspecified complications: Secondary | ICD-10-CM

## 2012-07-15 DIAGNOSIS — G8929 Other chronic pain: Secondary | ICD-10-CM

## 2012-07-15 MED ORDER — PREDNISONE 20 MG PO TABS: ORAL_TABLET | ORAL | Status: DC

## 2012-07-15 NOTE — Progress Notes (Signed)
HISTORY OF PRESENT ILLNESS:  Andrew Walls is a 52 y.o. male with morbid obesity, anxiety/depression, steatohepatitis, sigmoid colon cancer status post sigmoid colectomy January 2013 (T3, N0 M0). Seen in this office for abdominal pain and diarrhea. No evidence for infection. Set up for colonoscopy to evaluate diarrhea as well as colon cancer surveillance. This was performed 3 weeks ago. He was found to have diffuse colitis consistent with IBD. Biopsies consistent with the same. He was placed on Lialda 2.4 g twice a day. He has been compliant. Despite this, he reports no improvement in symptoms. He reports to 7 loose bowel movements a day, occasionally with blood. Overall feels bad. Still with abdominal discomfort. Fatigue. Poorly controlled blood sugars, apparently 300 600 range. He is with his cousin. No new problems.  REVIEW OF SYSTEMS:  All non-GI ROS negative except for headaches, muscle pains, leg cramps, fatigue  Past Medical History  Diagnosis Date  . Diabetes mellitus 08/03/2011  . Cancer of sigmoid colon, s/p colectomy [id number], pT3pN0 (0/8 LN) 08/03/2011  . Morbid obesity with BMI of 40.0-44.9, adult 08/03/2011  . Diverticulitis of colon 2006,2011,2012,2013    ?really perforated colon cancer  . Steatohepatitis   . Anxiety   . Depression   . Blood transfusion without reported diagnosis     age 44  . Cataract   . COPD (chronic obstructive pulmonary disease)   . Hypertension     Past Surgical History  Procedure Laterality Date  . Abdominal surgery    . Colon surgery  04/23/2011    left colectomy  . Colonoscopy    . Polypectomy    . Fracture left leg    . Fracture left arm      Social History Jaeshawn Silvio Cleckley  reports that he has never smoked. He has never used smokeless tobacco. He reports that he does not drink alcohol or use illicit drugs.  family history includes Cancer in his father and Heart disease in his maternal grandmother and mother.  There is no history of Colon  cancer, and Rectal cancer, and Stomach cancer, .  No Known Allergies     PHYSICAL EXAMINATION: Vital signs: BP 114/70  Pulse 72  Ht 6\' 2"  (1.88 m)  Wt 319 lb 12.8 oz (145.06 kg)  BMI 41.04 kg/m2 General: Morbidly obese, Well-developed, well-nourished, no acute distress HEENT: Sclerae are anicteric, conjunctiva pink. Oral mucosa intact Lungs: Clear Heart: Regular Abdomen: soft, obese, nontender, nondistended, no obvious ascites, no peritoneal signs, normal bowel sounds. No organomegaly. Extremities: No edema Psychiatric: Depressed-appearing, alert and oriented x3. Cooperative   ASSESSMENT:  #1. Newly diagnosed ulcerative colitis (colonoscopy 06/22/2012). On Lialda 4. grams daily without significant improvement clinically #2. History of sigmoid colon cancer status post resection January 2013. Recent CT questions pathologic lymph node. Been followed by oncology closely #3. Multiple medical problems including poorly controlled diabetes   PLAN:  #1. This point I would like to place the patient on steroids. He has not responded to high-dose oral mesalamine. Not interested in topical therapy. I did tell him that steroids may be an issue in terms of his blood sugar. I stressed importance of good glucose control. I emphasize the most worked very closely with his primary care provider on this issue. Long-term, immunomodulators maybe beneficial, however they take many weeks to months to assess for efficacy. Thus, I have prescribed prednisone 40 mg daily. He will continue on Lialda at the current dose. I would like to see him back in 3 weeks for  followup

## 2012-07-15 NOTE — Patient Instructions (Addendum)
We have sent the following medications to your pharmacy for you to pick up at your convenience:  Prednisone  Take the prednisone until directed otherwise.  Please follow up with Dr. Marina Goodell in 3 weeks

## 2012-07-21 ENCOUNTER — Other Ambulatory Visit: Payer: Self-pay | Admitting: Nurse Practitioner

## 2012-08-08 ENCOUNTER — Telehealth (INDEPENDENT_AMBULATORY_CARE_PROVIDER_SITE_OTHER): Payer: Self-pay

## 2012-08-08 NOTE — Telephone Encounter (Signed)
Patient brother called stating patient is in the hospital in [place] for high B/P and seizures. He is needing to see Dr.Gross for boils on his stomach and leg . Appointment scheduled for 08/18/12 @ 230p

## 2012-08-17 ENCOUNTER — Ambulatory Visit (INDEPENDENT_AMBULATORY_CARE_PROVIDER_SITE_OTHER): Payer: Medicare Other | Admitting: Internal Medicine

## 2012-08-17 ENCOUNTER — Other Ambulatory Visit: Payer: Medicare Other

## 2012-08-17 ENCOUNTER — Telehealth (INDEPENDENT_AMBULATORY_CARE_PROVIDER_SITE_OTHER): Payer: Self-pay | Admitting: *Deleted

## 2012-08-17 ENCOUNTER — Encounter: Payer: Self-pay | Admitting: Internal Medicine

## 2012-08-17 ENCOUNTER — Ambulatory Visit: Payer: Medicare Other | Admitting: Internal Medicine

## 2012-08-17 VITALS — BP 118/70 | HR 56 | Ht 74.0 in | Wt 318.0 lb

## 2012-08-17 DIAGNOSIS — Z85038 Personal history of other malignant neoplasm of large intestine: Secondary | ICD-10-CM

## 2012-08-17 DIAGNOSIS — E1165 Type 2 diabetes mellitus with hyperglycemia: Secondary | ICD-10-CM

## 2012-08-17 DIAGNOSIS — IMO0002 Reserved for concepts with insufficient information to code with codable children: Secondary | ICD-10-CM

## 2012-08-17 DIAGNOSIS — K5190 Ulcerative colitis, unspecified, without complications: Secondary | ICD-10-CM

## 2012-08-17 DIAGNOSIS — E118 Type 2 diabetes mellitus with unspecified complications: Secondary | ICD-10-CM

## 2012-08-17 NOTE — Progress Notes (Signed)
HISTORY OF PRESENT ILLNESS:  Andrew Walls is a 52 y.o. male with morbid obesity, poorly controlled diabetes mellitus anxiety/depression, steatohepatitis, sigmoid colon cancer status post sigmoid colectomy January 2013 (T3, N0, M0), and recently diagnosed universal ulcerative colitis (colonoscopy 06/22/2012). He was initially placed on Lialda 4.8 g daily. No improvement on followup 07/15/2012. He was placed on prednisone 40 mg daily. He was advised regarding poor blood sugar control and the effects of prednisone. Was on prednisone 40 mg daily for 2 weeks then, upon advice of his non-physician doesn't, decrease prednisone to 20 mg per week and currently 10 mg daily. This out of concern for poor blood pressure control. The telemetry that his PCP states that his blood sugars are fine , and discouraged endocrinology evaluation (according to patient and family). He presents now for followup. Apparently was in the hospital for a few days. Was on antibiotic. Stop his ulcerative colitis meds with exacerbation of diarrhea and pain. Now tells me that he has been feeling quite good. This is felt a while. No significant pain. More formed less frequent bowel movements. He is accompanied by a couple of relatives  REVIEW OF SYSTEMS:  All non-GI ROS negative except for arthritis, confusion, headaches, itching, night sweats, shortness of breath, irregular heartbeat, skin rash, sleeping problems, increased urination, dizziness  Past Medical History  Diagnosis Date  . Diabetes mellitus 08/03/2011  . Cancer of sigmoid colon, s/p colectomy [id number], pT3pN0 (0/8 LN) 08/03/2011  . Morbid obesity with BMI of 40.0-44.9, adult 08/03/2011  . Diverticulitis of colon 2006,2011,2012,2013    ?really perforated colon cancer  . Steatohepatitis   . Anxiety   . Depression   . Blood transfusion without reported diagnosis     age 46  . Cataract   . COPD (chronic obstructive pulmonary disease)   . Hypertension   . Seizures   .  Recurrent boils     Past Surgical History  Procedure Laterality Date  . Abdominal surgery    . Colon surgery  04/23/2011    left colectomy  . Colonoscopy    . Polypectomy    . Fracture left leg    . Fracture left arm    . Tonsillectomy      Social History Andrew Walls  reports that he has never smoked. He has never used smokeless tobacco. He reports that he does not drink alcohol or use illicit drugs.  family history includes Cancer in his father and Heart disease in his maternal grandmother and mother.  There is no history of Colon cancer, and Rectal cancer, and Stomach cancer, .  No Known Allergies     PHYSICAL EXAMINATION: Vital signs: BP 118/70  Pulse 56  Ht 6\' 2"  (1.88 m)  Wt 318 lb (144.244 kg)  BMI 40.81 kg/m2 General: Well-developed, obese, well-nourished, no acute distress HEENT: Sclerae are anicteric, conjunctiva pink. Oral mucosa intact Lungs: Clear Heart: Regular Abdomen: soft, nontender, obese, nondistended, no obvious ascites, no peritoneal signs, normal bowel sounds. No organomegaly. Extremities: No edema Psychiatric: alert and oriented x3. Cooperative     ASSESSMENT:  #1. Universal ulcerative colitis. Improved on prednisone. Currently on prednisone low-dose and Lialda #2. History of colon cancer #3. Poorly controlled diabetes   PLAN:  #1. Continue Lialda 4.8 g daily #2. Continue prednisone 10 mg daily #3. TPMT genetics today, in anticipation of possible immunomodulatory therapy long-term #4. Endocrinology referral at their request. I think this will be helpful #5. GI office followup in 6 weeks

## 2012-08-17 NOTE — Patient Instructions (Addendum)
Your physician has requested that you go to the basement for the following lab work before leaving today:  TPMT   We have scheduled an appointment for your with Dr. Ernest Haber at [facility] on 08-30-12 at 9:30am.  Please arrive 15 minutes early to register.  [facility] [address] [place].  Per Dr. Marina Goodell, stay on Prednisone 10mg  for 3 weeks and then stop and please continue the Lialda  Follow up with Dr. Marina Goodell in 6 weeks

## 2012-08-17 NOTE — Telephone Encounter (Signed)
Patient called to state the his boils have returned and are extremely painful.  Appt made for tomorrow for patient to see Dr. Michaell Cowing.

## 2012-08-18 ENCOUNTER — Ambulatory Visit (INDEPENDENT_AMBULATORY_CARE_PROVIDER_SITE_OTHER): Payer: Medicare Other | Admitting: Surgery

## 2012-08-18 ENCOUNTER — Encounter (INDEPENDENT_AMBULATORY_CARE_PROVIDER_SITE_OTHER): Payer: Medicaid Other | Admitting: Surgery

## 2012-08-18 ENCOUNTER — Encounter (HOSPITAL_COMMUNITY): Payer: Self-pay | Admitting: Emergency Medicine

## 2012-08-18 ENCOUNTER — Emergency Department (HOSPITAL_COMMUNITY)
Admission: EM | Admit: 2012-08-18 | Discharge: 2012-08-18 | Discharge status: Against Medical Advice | Payer: Medicare Other | Attending: Emergency Medicine | Admitting: Emergency Medicine

## 2012-08-18 DIAGNOSIS — F419 Anxiety disorder, unspecified: Secondary | ICD-10-CM

## 2012-08-18 DIAGNOSIS — R51 Headache: Secondary | ICD-10-CM

## 2012-08-18 DIAGNOSIS — G40909 Epilepsy, unspecified, not intractable, without status epilepticus: Secondary | ICD-10-CM | POA: Diagnosis not present

## 2012-08-18 DIAGNOSIS — F3289 Other specified depressive episodes: Secondary | ICD-10-CM | POA: Insufficient documentation

## 2012-08-18 DIAGNOSIS — F411 Generalized anxiety disorder: Secondary | ICD-10-CM | POA: Diagnosis not present

## 2012-08-18 DIAGNOSIS — M549 Dorsalgia, unspecified: Secondary | ICD-10-CM

## 2012-08-18 DIAGNOSIS — J449 Chronic obstructive pulmonary disease, unspecified: Secondary | ICD-10-CM | POA: Diagnosis not present

## 2012-08-18 DIAGNOSIS — I10 Essential (primary) hypertension: Secondary | ICD-10-CM | POA: Diagnosis not present

## 2012-08-18 DIAGNOSIS — F329 Major depressive disorder, single episode, unspecified: Secondary | ICD-10-CM | POA: Insufficient documentation

## 2012-08-18 DIAGNOSIS — R253 Fasciculation: Secondary | ICD-10-CM

## 2012-08-18 DIAGNOSIS — J4489 Other specified chronic obstructive pulmonary disease: Secondary | ICD-10-CM | POA: Insufficient documentation

## 2012-08-18 DIAGNOSIS — Z8669 Personal history of other diseases of the nervous system and sense organs: Secondary | ICD-10-CM | POA: Insufficient documentation

## 2012-08-18 DIAGNOSIS — R109 Unspecified abdominal pain: Secondary | ICD-10-CM | POA: Diagnosis present

## 2012-08-18 DIAGNOSIS — Z8719 Personal history of other diseases of the digestive system: Secondary | ICD-10-CM | POA: Insufficient documentation

## 2012-08-18 DIAGNOSIS — Z9889 Other specified postprocedural states: Secondary | ICD-10-CM | POA: Diagnosis not present

## 2012-08-18 DIAGNOSIS — R1031 Right lower quadrant pain: Secondary | ICD-10-CM

## 2012-08-18 DIAGNOSIS — G8929 Other chronic pain: Secondary | ICD-10-CM | POA: Insufficient documentation

## 2012-08-18 DIAGNOSIS — E119 Type 2 diabetes mellitus without complications: Secondary | ICD-10-CM | POA: Insufficient documentation

## 2012-08-18 DIAGNOSIS — IMO0002 Reserved for concepts with insufficient information to code with codable children: Secondary | ICD-10-CM | POA: Insufficient documentation

## 2012-08-18 DIAGNOSIS — Z79899 Other long term (current) drug therapy: Secondary | ICD-10-CM | POA: Diagnosis not present

## 2012-08-18 DIAGNOSIS — R519 Headache, unspecified: Secondary | ICD-10-CM | POA: Insufficient documentation

## 2012-08-18 DIAGNOSIS — Z794 Long term (current) use of insulin: Secondary | ICD-10-CM | POA: Diagnosis not present

## 2012-08-18 DIAGNOSIS — R259 Unspecified abnormal involuntary movements: Secondary | ICD-10-CM

## 2012-08-18 LAB — COMPREHENSIVE METABOLIC PANEL
ALT: 44 U/L (ref 0–53)
BUN: 17 mg/dL (ref 6–23)
CO2: 19 mEq/L (ref 19–32)
Calcium: 9.3 mg/dL (ref 8.4–10.5)
Creatinine, Ser: 0.98 mg/dL (ref 0.50–1.35)
GFR calc Af Amer: >90 mL/min (ref >90)
GFR calc non Af Amer: >90 mL/min (ref >90)
Glucose, Bld: 357 mg/dL — ABNORMAL HIGH (ref 70–99)
Sodium: 134 mEq/L — ABNORMAL LOW (ref 135–145)
Total Protein: 7.6 g/dL (ref 6.0–8.3)

## 2012-08-18 LAB — URINALYSIS, ROUTINE W REFLEX MICROSCOPIC
Bilirubin Urine: NEGATIVE
Leukocytes, UA: NEGATIVE
Nitrite: NEGATIVE
Specific Gravity, Urine: 1.029 (ref 1.005–1.030)
Urobilinogen, UA: 0.2 mg/dL (ref 0.0–1.0)
pH: 5.5 (ref 5.0–8.0)

## 2012-08-18 LAB — CBC WITH DIFFERENTIAL/PLATELET
Eosinophils Absolute: 0.1 10*3/uL (ref 0.0–0.7)
Eosinophils Relative: 1 % (ref 0–5)
HCT: 35.6 % — ABNORMAL LOW (ref 39.0–52.0)
Lymphocytes Relative: 6 % — ABNORMAL LOW (ref 12–46)
Lymphs Abs: 0.7 10*3/uL (ref 0.7–4.0)
MCH: 29.2 pg (ref 26.0–34.0)
MCV: 86.6 fL (ref 78.0–100.0)
Monocytes Absolute: 0.4 10*3/uL (ref 0.1–1.0)
Monocytes Relative: 4 % (ref 3–12)
Platelets: 292 10*3/uL (ref 150–400)
RBC: 4.11 MIL/uL — ABNORMAL LOW (ref 4.22–5.81)
WBC: 11.4 10*3/uL — ABNORMAL HIGH (ref 4.0–10.5)

## 2012-08-18 LAB — POCT I-STAT, CHEM 8
Calcium, Ion: 1.19 mmol/L (ref 1.12–1.23)
Chloride: 102 mEq/L (ref 96–112)
Glucose, Bld: 342 mg/dL — ABNORMAL HIGH (ref 70–99)
HCT: 37 % — ABNORMAL LOW (ref 39.0–52.0)
Hemoglobin: 12.6 g/dL — ABNORMAL LOW (ref 13.0–17.0)
Potassium: 4.5 mEq/L (ref 3.5–5.1)

## 2012-08-18 LAB — URINE MICROSCOPIC-ADD ON

## 2012-08-18 MED ORDER — INSULIN ASPART 100 UNIT/ML ~~LOC~~ SOLN
5.0000 [IU] | Freq: Once | SUBCUTANEOUS | Status: DC
  Filled 2012-08-18: qty 1

## 2012-08-18 MED ORDER — HYDROMORPHONE HCL PF 1 MG/ML IJ SOLN
0.5000 mg | Freq: Once | INTRAMUSCULAR | Status: DC
  Filled 2012-08-18: qty 1

## 2012-08-18 MED ORDER — IOHEXOL 300 MG/ML  SOLN: 100.0000 mL | Freq: Once | INTRAMUSCULAR | Status: DC | PRN

## 2012-08-18 MED ORDER — HYDROMORPHONE HCL PF 1 MG/ML IJ SOLN
1.0000 mg | Freq: Once | INTRAMUSCULAR | Status: AC
  Administered 2012-08-18: 1 mg via INTRAVENOUS
  Filled 2012-08-18: qty 1

## 2012-08-18 MED ORDER — SODIUM CHLORIDE 0.9 % IV BOLUS (SEPSIS)
1000.0000 mL | Freq: Once | INTRAVENOUS | Status: AC
  Administered 2012-08-18: 1000 mL via INTRAVENOUS

## 2012-08-18 MED ORDER — IOHEXOL 300 MG/ML  SOLN
25.0000 mL | INTRAMUSCULAR | Status: AC
  Administered 2012-08-18 (×2): 25 mL via ORAL

## 2012-08-18 NOTE — ED Notes (Addendum)
IV removed and pt left ED AMA, refusing to sign any paper work, ambulatory through ED waiting.

## 2012-08-18 NOTE — ED Notes (Addendum)
Hourly round check, pt cursing at RN stating "I should have stayed home and ate Tylenol, if you fuckers are going to give me 1mg  at a time" ED-P notified. Called CT for estimated time pt would be going to CT. Pt informed of delay.

## 2012-08-18 NOTE — ED Provider Notes (Signed)
History     CSN: [id number]  Arrival date & time 08/18/12  1132   First MD Initiated Contact with Patient 08/18/12 1158      Chief Complaint  Patient presents with  . Abdominal Pain    (Consider location/radiation/quality/duration/timing/severity/associated sxs/prior treatment) HPI Comments: 52 y.o. male who presents to the Er after being evaluated in the surgery clinic. He was being evaluated for abscesses that he has on his abdomen that are draining. Surgery team states he had seizure like activity and sent him to our ER. Pt states he had an inpatient stay at a hospital a few weeks ago, and he was "hooked up to monitors", and told that he did not need seizure medicine because he was told his seizures activity was not seizures and didn't require any medications. Pt states that after he was at surgery office he had abdominal pain. He returned back to baseline and is acting appropriately now per his friend in the room. Pt without fevers. Pain is lower right quadrant.   Patient is a 52 y.o. male presenting with general illness. The history is provided by the patient.  Illness Severity:  Mild Onset quality:  Gradual Timing:  Constant Progression:  Unchanged Chronicity:  New Associated symptoms: abdominal pain   Associated symptoms: no chest pain, no diarrhea, no fatigue, no fever, no headaches, no nausea, no rash, no shortness of breath and no wheezing     Past Medical History  Diagnosis Date  . Diabetes mellitus 08/03/2011  . Cancer of sigmoid colon, s/p colectomy [id number], pT3pN0 (0/8 LN) 08/03/2011  . Morbid obesity with BMI of 40.0-44.9, adult 08/03/2011  . Diverticulitis of colon 2006,2011,2012,2013    ?really perforated colon cancer  . Steatohepatitis   . Anxiety   . Depression   . Blood transfusion without reported diagnosis     age 33  . Cataract   . COPD (chronic obstructive pulmonary disease)   . Hypertension   . Seizures   . Recurrent boils     Past Surgical History   Procedure Laterality Date  . Abdominal surgery    . Colon surgery  04/23/2011    left colectomy  . Colonoscopy    . Polypectomy    . Fracture left leg    . Fracture left arm    . Tonsillectomy      Family History  Problem Relation Age of Onset  . Heart disease Mother   . Cancer Father     melanoma  . Heart disease Maternal Grandmother   . Colon cancer Neg Hx   . Rectal cancer Neg Hx   . Stomach cancer Neg Hx     History  Substance Use Topics  . Smoking status: Never Smoker   . Smokeless tobacco: Never Used  . Alcohol Use: No      Review of Systems  Constitutional: Negative for fever, chills, activity change and fatigue.  HENT: Negative for drooling, mouth sores and neck pain.   Eyes: Negative for pain and discharge.  Respiratory: Negative for chest tightness, shortness of breath and wheezing.   Cardiovascular: Negative for chest pain.  Gastrointestinal: Positive for abdominal pain. Negative for nausea, diarrhea and constipation.  Genitourinary: Negative for dysuria, flank pain and difficulty urinating.  Musculoskeletal: Negative for back pain and arthralgias.  Skin: Negative for color change, pallor and rash.  Neurological: Negative for dizziness, weakness, light-headedness and headaches.  Psychiatric/Behavioral: Negative for behavioral problems, confusion and agitation.    Allergies  Review of patient's allergies  indicates no known allergies.  Home Medications   Current Outpatient Rx  Name  Route  Sig  Dispense  Refill  . albuterol (VENTOLIN HFA) 108 (90 BASE) MCG/ACT inhaler   Inhalation   Inhale 2 puffs into the lungs every 6 (six) hours as needed.         . budesonide-formoterol (SYMBICORT) 80-4.5 MCG/ACT inhaler   Inhalation   Inhale 2 puffs into the lungs 2 (two) times daily.         . diclofenac (VOLTAREN) 75 MG EC tablet   Oral   Take 75 mg by mouth as needed.         . diphenoxylate-atropine (LOMOTIL) 2.5-0.025 MG per tablet      TAKE  ONE TABLET BY MOUTH THREE TO FOUR TIMES DAILY AS NEEDED FOR DIARRHEA, STOP TAKING TWO DAYS PRIOR TO COLONOSCOPY.   120 tablet   0   . gabapentin (NEURONTIN) 400 MG capsule   Oral   Take 400 mg by mouth at bedtime.         Marland Kitchen glipiZIDE (GLUCOTROL) 10 MG tablet   Oral   Take 10 mg by mouth 2 (two) times daily before a meal.         . Insulin Lispro, Human, (HUMALOG KWIKPEN [place])   Subcutaneous   Inject into the skin as needed.         . irbesartan (AVAPRO) 75 MG tablet   Oral   Take 75 mg by mouth daily.         . meclizine (ANTIVERT) 25 MG tablet   Oral   Take 25 mg by mouth 3 (three) times daily as needed for dizziness.         . mesalamine (LIALDA) 1.2 G EC tablet   Oral   Take 2 tablets (2.4 g total) by mouth 2 (two) times daily.   120 tablet   3   . metFORMIN (GLUCOPHAGE) 500 MG tablet   Oral   Take 500 mg by mouth 2 (two) times daily with a meal. 500 mg in the am and 1000mg  at bedtime         . ondansetron (ZOFRAN) 4 MG tablet   Oral   Take 4 mg by mouth every 8 (eight) hours as needed for nausea.         Marland Kitchen OVER THE COUNTER MEDICATION      Centirize hydrochloride 10 mg,  1 tablet every AM for allergy         . oxyCODONE-acetaminophen (PERCOCET/ROXICET) 5-325 MG per tablet   Oral   Take 1-2 tablets by mouth every 6 (six) hours as needed for pain.   15 tablet   0   . predniSONE (DELTASONE) 20 MG tablet      Take 2 tablets by mouth each morning   60 tablet   2   . temazepam (RESTORIL) 30 MG capsule   Oral   Take 30 mg by mouth at bedtime.         . triamcinolone cream (KENALOG) 0.1 %   Topical   Apply 1 application topically 2 (two) times daily.         Marland Kitchen triamcinolone ointment (KENALOG) 0.1 %   Topical   Apply 1 application topically 2 (two) times daily.           BP 167/82  Pulse 80  Temp(Src) 97.7 F (36.5 C) (Oral)  Resp 20  Ht 6\' 2"  (1.88 m)  Wt 318 lb (144.244 kg)  BMI 40.81 kg/m2  SpO2 100%  Physical Exam   Constitutional: He is oriented to person, place, and time. He appears well-developed. No distress.  HENT:  Head: Normocephalic.  Eyes: Pupils are equal, round, and reactive to light. Right eye exhibits no discharge. Left eye exhibits no discharge.  Neck: Neck supple. No tracheal deviation present.  Cardiovascular: Regular rhythm and intact distal pulses.   Pulmonary/Chest: Effort normal. No stridor. No respiratory distress. He has no wheezes.  Abdominal: Soft. He exhibits no distension. Tenderness: mild right lower quadrant ttp. There is no rebound.  Multiple abscess that are actively draining on abdomen, no erythema or soft tissue swelling or areas of fluctuance   Genitourinary:  Testes are nontender to palpation, both descended, no hernias appreciated   Musculoskeletal: Normal range of motion. He exhibits no tenderness.  Neurological: He is alert and oriented to person, place, and time. No cranial nerve deficit.  Skin: Skin is warm. No rash noted. He is not diaphoretic. No erythema.    ED Course  Procedures (including critical care time)  Labs Reviewed  CBC WITH DIFFERENTIAL - Abnormal; Notable for the following:    WBC 11.4 (*)    RBC 4.11 (*)    Hemoglobin 12.0 (*)    HCT 35.6 (*)    Neutrophils Relative % 90 (*)    Neutro Abs 10.2 (*)    Lymphocytes Relative 6 (*)    All other components within normal limits  COMPREHENSIVE METABOLIC PANEL - Abnormal; Notable for the following:    Sodium 134 (*)    Glucose, Bld 357 (*)    All other components within normal limits  URINALYSIS, ROUTINE W REFLEX MICROSCOPIC - Abnormal; Notable for the following:    Glucose, UA >1000 (*)    Ketones, ur 15 (*)    All other components within normal limits  URINE MICROSCOPIC-ADD ON - Abnormal; Notable for the following:    Squamous Epithelial / LPF FEW (*)    All other components within normal limits  POCT I-STAT, CHEM 8 - Abnormal; Notable for the following:    Glucose, Bld 342 (*)     Hemoglobin 12.6 (*)    HCT 37.0 (*)    All other components within normal limits   No results found.   MDM  Per pt's hpi, he has hx of pseudoseizure. Suspect this is what happened to him at the surgery clinic. Will get ct abd pelvis to eval for his abdominal pain. If negative, will plan to d/c patient w/ instructions to f/u with pcp. No peritoneal signs on exam, no signs of abscess that need to be drained here in the Er.   Ct scan was about to be performed and pt was given dilaudid -- but now he is refusing care. He states he feels he was not getting enough pain medicine. He states he wants to leave the ER. Pt has capacity and is able to make decisions -- knows person / president / place. He knows his overall disposition. He denies SI and HI. He is aggressive and yells how he "is getting the hell out of here". He states he will not sign AMA paperwork. I have verbally told pt that we are more than happy to care for him, and repeat pain meds had already been ordered, and we dont' want him to leave because he could very well have a life threatening condition. He states he understands, but doesn't want to be in the ER. He states he understands he  could have life threatening condition that could lead to death or permanent disability that could cause him to die if he leaves the ER -- but states he is "getting the hell out of here". This extensive discussion about harms of leaving the Er were discussed in front of pt's nurse.   Pt has eloped from the Er, refusing to sign paperwork, or to wait for further evaluation.   1. Abdominal pain            Bernadene Person, MD 08/20/12 1546

## 2012-08-18 NOTE — ED Notes (Signed)
CT informed pt has finished 1st cup of contrast. Pt instructed to start on 2nd cup

## 2012-08-18 NOTE — Progress Notes (Signed)
Subjective:     Patient ID: Andrew Walls, male   DOB: 07-30-1960, 51 y.o.   MRN: [id number]  HPI  [name]  [birth year] [id number]  Patient Care Team: Ignatius Specking, MD as PCP - General (Internal Medicine) Michae Kava, MD as Consulting Physician (Hematology and Oncology) Ardeth Sportsman, MD as Attending Physician (General Surgery)  This patient is a 52 y.o.male who presents today for surgical evaluation at the request of Dr Sherril Croon.   Reason for visit: Boils on right abdomen  Super morbidly obese male that underwent colectomy last year at an outside hospital.  Patient had difficulty with possible fistulas and other issues.  Wished to have care assumed tone in [place].  Eventually recovered from that.  I have not seen him in over a year.  Apparently he has had problems with boils draining on his abdomen.  He was asked to be fitted urgently today.  When he arrived he was complaining of severe abdominal and back pain and in tears.  One prominent in emerge the office he began to be unresponsive and tremorous with twitching especially on his arms and jerking of his head and neck.  This lasted for 10-20 seconds.  And then he would start crying and be oriented.  Not tachycardic.  No tachypnea.  Sats in the high 90s.  Came today with a friend.  In a wheelchair.  None incurred an of urine or stool.  No drainage from the wounds.  Glucoses in the 300s this morning.  Poorly controlled diabetic  Patient Active Problem List   Diagnosis Date Noted  . Anxiety 08/18/2012  . Muscle twitching - Panic attacks vs seizures 08/18/2012  . Abdominal pain, chronic, right lower quadrant 08/18/2012  . Back pain 08/18/2012  . Headache(784.0) 08/18/2012  . Abdominal pain, chronic, left lower quadrant 11/27/2011  . Diarrhea 08/27/2011  . Diabetes mellitus 08/03/2011  . Cancer of sigmoid colon, s/p colectomy [id number], cT4pN0 (5.5cm, 0/18 LN), [place] (-) 08/03/2011  . Morbid obesity with BMI of 40.0-44.9,  adult 08/03/2011  . Dizziness - light-headed 08/03/2011  . Diabetic neuropathy, type II diabetes mellitus 08/03/2011  . Depression 08/03/2011  . Postoperative intra-abdominal abscess 08/03/2011  . Steatohepatitis 08/03/2011    Past Medical History  Diagnosis Date  . Diabetes mellitus 08/03/2011  . Cancer of sigmoid colon, s/p colectomy [id number], pT3pN0 (0/8 LN) 08/03/2011  . Morbid obesity with BMI of 40.0-44.9, adult 08/03/2011  . Diverticulitis of colon 2006,2011,2012,2013    ?really perforated colon cancer  . Steatohepatitis   . Anxiety   . Depression   . Blood transfusion without reported diagnosis     age 73  . Cataract   . COPD (chronic obstructive pulmonary disease)   . Hypertension   . Seizures   . Recurrent boils     Past Surgical History  Procedure Laterality Date  . Abdominal surgery    . Colon surgery  04/23/2011    left colectomy  . Colonoscopy    . Polypectomy    . Fracture left leg    . Fracture left arm    . Tonsillectomy      History   Social History  . Marital Status: Single    Spouse Name: N/A    Number of Children: N/A  . Years of Education: N/A   Occupational History  . Not on file.   Social History Main Topics  . Smoking status: Never Smoker   . Smokeless tobacco: Never Used  .  Alcohol Use: No  . Drug Use: No  . Sexually Active: Not on file   Other Topics Concern  . Not on file   Social History Narrative  . No narrative on file    Family History  Problem Relation Age of Onset  . Heart disease Mother   . Cancer Father     melanoma  . Heart disease Maternal Grandmother   . Colon cancer Neg Hx   . Rectal cancer Neg Hx   . Stomach cancer Neg Hx     Current Outpatient Prescriptions  Medication Sig Dispense Refill  . albuterol (VENTOLIN HFA) 108 (90 BASE) MCG/ACT inhaler Inhale 2 puffs into the lungs every 6 (six) hours as needed.      . budesonide-formoterol (SYMBICORT) 80-4.5 MCG/ACT inhaler Inhale 2 puffs into the lungs 2  (two) times daily.      . diclofenac (VOLTAREN) 75 MG EC tablet Take 75 mg by mouth as needed.      . diphenoxylate-atropine (LOMOTIL) 2.5-0.025 MG per tablet TAKE ONE TABLET BY MOUTH THREE TO FOUR TIMES DAILY AS NEEDED FOR DIARRHEA, STOP TAKING TWO DAYS PRIOR TO COLONOSCOPY.  120 tablet  0  . gabapentin (NEURONTIN) 400 MG capsule Take 400 mg by mouth at bedtime.      Marland Kitchen glipiZIDE (GLUCOTROL) 10 MG tablet Take 10 mg by mouth 2 (two) times daily before a meal.      . Insulin Lispro, Human, (HUMALOG KWIKPEN [place]) Inject into the skin as needed.      . irbesartan (AVAPRO) 75 MG tablet Take 75 mg by mouth daily.      . meclizine (ANTIVERT) 25 MG tablet Take 25 mg by mouth 3 (three) times daily as needed for dizziness.      . mesalamine (LIALDA) 1.2 G EC tablet Take 2 tablets (2.4 g total) by mouth 2 (two) times daily.  120 tablet  3  . metFORMIN (GLUCOPHAGE) 500 MG tablet Take 500 mg by mouth 2 (two) times daily with a meal. 500 mg in the am and 1000mg  at bedtime      . ondansetron (ZOFRAN) 4 MG tablet Take 4 mg by mouth every 8 (eight) hours as needed for nausea.      Marland Kitchen OVER THE COUNTER MEDICATION Centirize hydrochloride 10 mg,  1 tablet every AM for allergy      . oxyCODONE-acetaminophen (PERCOCET/ROXICET) 5-325 MG per tablet Take 1-2 tablets by mouth every 6 (six) hours as needed for pain.  15 tablet  0  . predniSONE (DELTASONE) 20 MG tablet Take 2 tablets by mouth each morning  60 tablet  2  . temazepam (RESTORIL) 30 MG capsule Take 30 mg by mouth at bedtime.      . triamcinolone cream (KENALOG) 0.1 % Apply 1 application topically 2 (two) times daily.      Marland Kitchen triamcinolone ointment (KENALOG) 0.1 % Apply 1 application topically 2 (two) times daily.       No current facility-administered medications for this visit.     No Known Allergies  There were no vitals taken for this visit.  No results found.   Review of Systems  Constitutional: Positive for activity change and fatigue. Negative for  fever, chills, diaphoresis and appetite change.  HENT: Negative for nosebleeds, sore throat, facial swelling, sneezing, mouth sores, trouble swallowing, voice change, postnasal drip, sinus pressure and ear discharge.   Eyes: Negative for photophobia, discharge and visual disturbance.  Respiratory: Positive for cough. Negative for apnea, choking, chest tightness,  shortness of breath, wheezing and stridor.   Cardiovascular: Negative for chest pain and palpitations.  Gastrointestinal: Positive for abdominal pain. Negative for nausea, vomiting, diarrhea, constipation, blood in stool, abdominal distention, anal bleeding and rectal pain.  Endocrine: Negative for cold intolerance, heat intolerance, polydipsia, polyphagia and polyuria.  Genitourinary: Negative for dysuria, urgency, difficulty urinating and testicular pain.  Musculoskeletal: Positive for myalgias, back pain and arthralgias. Negative for gait problem.  Skin: Positive for wound. Negative for color change, pallor and rash.  Allergic/Immunologic: Positive for immunocompromised state. Negative for environmental allergies and food allergies.  Neurological: Positive for dizziness, tremors, seizures, weakness, light-headedness and headaches. Negative for syncope, speech difficulty and numbness.  Hematological: Negative for adenopathy. Does not bruise/bleed easily.  Psychiatric/Behavioral: Positive for dysphoric mood and agitation. Negative for hallucinations and confusion. The patient is nervous/anxious.        Objective:   Physical Exam  Constitutional: He is oriented to person, place, and time. He appears well-developed and well-nourished. He appears listless. No distress.  HENT:  Head: Normocephalic. Head is without abrasion and without contusion.  Mouth/Throat: Uvula is midline, oropharynx is clear and moist and mucous membranes are normal. No oropharyngeal exudate or posterior oropharyngeal edema.  Eyes: Conjunctivae, EOM and lids are  normal. Pupils are equal, round, and reactive to light. No scleral icterus. Right pupil is reactive. Left pupil is reactive. Pupils are equal.  Neck: Normal range of motion. Neck supple. No tracheal deviation present.  Cardiovascular: Normal rate, regular rhythm and intact distal pulses.   Pulmonary/Chest: Effort normal and breath sounds normal. No accessory muscle usage. Not tachypneic and not bradypneic. No respiratory distress. He has no decreased breath sounds. He has no wheezes. He has no rhonchi. He has no rales. He exhibits no tenderness.  Abdominal: Soft. He exhibits no distension and no mass. There is tenderness. There is no rigidity, no rebound, no guarding, no CVA tenderness, no tenderness at McBurney's point and negative Murphy's sign. Hernia confirmed negative in the right inguinal area and confirmed negative in the left inguinal area.    Musculoskeletal: Normal range of motion. He exhibits no tenderness.  Lymphadenopathy:    He has no cervical adenopathy.       Right: No inguinal adenopathy present.       Left: No inguinal adenopathy present.  Neurological: He is oriented to person, place, and time. He appears listless. He displays tremor. He displays no atrophy. No cranial nerve deficit or sensory deficit. He exhibits normal muscle tone. He displays seizure activity. GCS eye subscore is 4. GCS verbal subscore is 5. GCS motor subscore is 6.  Intermittently not responsive and totally oriented.  Tearful.  No focal deficits.  Cranial nerves II through XII intact.  Skin: Skin is warm and dry. No rash noted. He is not diaphoretic. No erythema. No pallor.  Psychiatric: Thought content normal. His mood appears anxious. His affect is labile and inappropriate. His speech is delayed. He is agitated. Thought content is not delusional. He expresses no suicidal plans and no homicidal plans.  Intermittently tearful and unresponsive.  Then more calm and answers questions appropriately.  Intermittently  clenching cramping hands.       Assessment:     Agitation with intermittent unresponsiveness and tremors.  Panic attack vs. Seizure.  Small subcutaneous boils with necrosis but no deep abscess or fasciitis.  Abdominal and back pain of uncertain etiology.     Plan:     He called 911 for him is to take  him to the emergency room now.  I suspect this is just a panic attack since he has not incontinent to urine or stool and oriented very quickly.  However, I must be safe.  He probably would benefit from a CAT scan of his chest abdomen pelvis to figure out what is going on with his abdominal and back pain especially given his history of cancer.  Also make sure there is no deeper soft tissue infection from his right panniculus.  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  However, I am hopeful these are just superficial boils that are spontaneously draining and do not require any more surgery or intervention except for regular wound care.

## 2012-08-18 NOTE — ED Notes (Addendum)
Pt has 2x2 dsg to RLQ. Pt reports it is from I/D done by [facility]. Pt appears anxious and is tearful. Reports he took 2 percocet 5mg  at 0830 and no pain relief.

## 2012-08-18 NOTE — ED Notes (Signed)
ED-P in room speaking with pt at this time. Pt yelling in a pressured voice at ED-P reporting he is leaving and refuses to sign any paperwork.

## 2012-08-18 NOTE — ED Notes (Addendum)
Pt cursing staff stating "I don't want nothing. I got somebody on way to get me". Pt refusing medications and CT scan. Pt threatening harm to staff if RN does not leave pts room. ED-P notified.

## 2012-08-18 NOTE — ED Notes (Addendum)
Pt sent to be evaluated by [facility] for abd pain and possible seizure activity. Pt was in office for I/D of multiple abscess and had c/o right side abd pain that started at 0500. Pt hx of hernia. No seizure like activity witnessed by EMS. CBG 336 per EMS. NAD noted.

## 2012-08-18 NOTE — ED Notes (Signed)
CBG 332 

## 2012-08-18 NOTE — ED Provider Notes (Signed)
I saw and evaluated the patient, reviewed the resident's note and I agree with the findings and plan.  This patient was in the [facility] [place] [facility] today for evaluation of abdominal wall chronic abscesses and the patient noted that he had several hours of right lower quadrant abdominal pain and was sent to the ED for CT scan of his abdomen and pelvis his abdomen is mild right lower quadrant tenderness without rebound the rest of the abdomen is nontender he has some superficial-appearing spontaneously draining right lower quadrant abdominal wall abscesses his testicles are nontender he has no palpable inguinal hernias.  Hurman Horn, MD 08/18/12 2202

## 2012-08-19 LAB — GLUCOSE, CAPILLARY: Glucose-Capillary: 332 mg/dL — ABNORMAL HIGH (ref 70–99)

## 2012-08-26 ENCOUNTER — Encounter (INDEPENDENT_AMBULATORY_CARE_PROVIDER_SITE_OTHER): Payer: Medicare Other | Admitting: Surgery

## 2012-08-27 LAB — THIOPURINE METHYLTRANSFERASE (TPMT), RBC: Thiopurine Methyltransferase, RBC: 18

## 2012-08-30 ENCOUNTER — Ambulatory Visit: Payer: Medicare Other | Admitting: Internal Medicine

## 2012-09-20 ENCOUNTER — Telehealth (INDEPENDENT_AMBULATORY_CARE_PROVIDER_SITE_OTHER): Payer: Self-pay | Admitting: *Deleted

## 2012-09-20 NOTE — Telephone Encounter (Signed)
Patient called asking if he could come in earlier than his appt on 09/28/12 to see Dr. Michaell Cowing for a new boil on his left leg.  Spoke to Dr. Michaell Cowing who offered 3p appt today however patient states he can't come today due to transportation.  Patient stated he can only get transportation on Wed's.  Explained that Dr. Michaell Cowing doesn't have any appts for tomorrow however he could be seen in urgent office but it wouldn't be Dr. Michaell Cowing.  Patient stated thank you and hung up.

## 2012-09-28 ENCOUNTER — Encounter (INDEPENDENT_AMBULATORY_CARE_PROVIDER_SITE_OTHER): Payer: Medicare Other | Admitting: Surgery

## 2012-09-29 ENCOUNTER — Ambulatory Visit: Payer: Medicare Other | Admitting: Internal Medicine

## 2012-10-05 ENCOUNTER — Ambulatory Visit: Payer: Medicare Other | Admitting: Internal Medicine

## 2012-10-26 ENCOUNTER — Ambulatory Visit (INDEPENDENT_AMBULATORY_CARE_PROVIDER_SITE_OTHER): Payer: Medicare Other | Admitting: Internal Medicine

## 2012-10-26 ENCOUNTER — Encounter: Payer: Self-pay | Admitting: Internal Medicine

## 2012-10-26 VITALS — BP 122/80 | HR 80 | Ht 74.0 in | Wt 313.0 lb

## 2012-10-26 DIAGNOSIS — K5190 Ulcerative colitis, unspecified, without complications: Secondary | ICD-10-CM

## 2012-10-26 NOTE — Progress Notes (Signed)
HISTORY OF PRESENT ILLNESS:  Andrew Walls is a 52 y.o. 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 Treated with Lialda and steroids. Last seen in the office 08/17/2012. Presents today for followup. Accompanied by his relative. He has been off steroids for one month. Continues on Lialda 4.8 g daily. Appendectomy report no colitis symptoms. Regular formed or soft bowel movements without mucus or blood. No abdominal pain. Still having trouble with diabetes. Yet to see endocrinologist.  REVIEW OF SYSTEMS:  All non-GI ROS negative except for back pain, visual change, confusion, depression, fatigue, headaches, hearing problems, itching, muscle cramps, night sweats, shortness of breath, skin rash, sleeping problems, ankle edema, increased thirst, increased urination, irregular heartbeat at times  Past Medical History  Diagnosis Date  . Diabetes mellitus 08/03/2011  . Cancer of sigmoid colon, s/p colectomy [id number], pT3pN0 (0/8 LN) 08/03/2011  . Morbid obesity with BMI of 40.0-44.9, adult 08/03/2011  . Diverticulitis of colon 2006,2011,2012,2013    ?really perforated colon cancer  . Steatohepatitis   . Anxiety   . Depression   . Blood transfusion without reported diagnosis     age 52  . Cataract   . COPD (chronic obstructive pulmonary disease)   . Hypertension   . Seizures   . Recurrent boils     Past Surgical History  Procedure Laterality Date  . Abdominal surgery    . Colon surgery  04/23/2011    left colectomy  . Colonoscopy    . Polypectomy    . Fracture left leg    . Fracture left arm    . Tonsillectomy      Social History Joeanthony Seeling Bergman  reports that he has never smoked. He has never used smokeless tobacco. He reports that he does not drink alcohol or use illicit drugs.  family history includes Cancer in his father  and Heart disease in his maternal grandmother and mother.  There is no history of Colon cancer, and Rectal cancer, and Stomach cancer, .  Allergies  Allergen Reactions  . Sulfamethoxazole        PHYSICAL EXAMINATION: Vital signs: BP 122/80  Pulse 80  Ht 6\' 2"  (1.88 m)  Wt 313 lb (141.976 kg)  BMI 40.17 kg/m2 General: Well-developed, obese,  well-nourished, no acute distress HEENT: Sclerae are anicteric, conjunctiva pink. Oral mucosa intact Lungs: Clear Heart: Regular Abdomen: soft, obese, nontender, nondistended, no obvious ascites, no peritoneal signs, normal bowel sounds. No organomegaly. Extremities: No edema Psychiatric: alert and oriented x3. Cooperative    ASSESSMENT:  #1. Universal ulcerative colitis. Asymptomatic after a course of steroids. Continues on Lialda. Last colonoscopy April 2014 #2. Colon cancer status post sigmoid resection January 2013   PLAN:   #1. Continue Lialda 4.8 g daily #2. Routine GI office followup in 3 months #3. Relook colonoscopy around April 2015

## 2012-10-26 NOTE — Patient Instructions (Addendum)
Please stay on your Lialda and follow up with Dr. Marina Goodell in 3 months

## 2012-10-28 ENCOUNTER — Other Ambulatory Visit: Payer: Self-pay | Admitting: Internal Medicine

## 2013-01-02 ENCOUNTER — Other Ambulatory Visit: Payer: Self-pay | Admitting: Internal Medicine

## 2013-01-11 ENCOUNTER — Ambulatory Visit (INDEPENDENT_AMBULATORY_CARE_PROVIDER_SITE_OTHER): Payer: Medicare Other | Admitting: Internal Medicine

## 2013-01-11 ENCOUNTER — Encounter: Payer: Self-pay | Admitting: Internal Medicine

## 2013-01-11 VITALS — BP 106/80 | HR 64 | Ht 74.0 in | Wt 312.0 lb

## 2013-01-11 DIAGNOSIS — K5190 Ulcerative colitis, unspecified, without complications: Secondary | ICD-10-CM

## 2013-01-11 DIAGNOSIS — R1032 Left lower quadrant pain: Secondary | ICD-10-CM

## 2013-01-11 DIAGNOSIS — G8929 Other chronic pain: Secondary | ICD-10-CM

## 2013-01-11 DIAGNOSIS — Z85038 Personal history of other malignant neoplasm of large intestine: Secondary | ICD-10-CM

## 2013-01-11 NOTE — Patient Instructions (Signed)
You have been given some samples of Lialda.  I will make sure you have enough at the pharmacy

## 2013-01-11 NOTE — Progress Notes (Signed)
HISTORY OF PRESENT ILLNESS:  Andrew Walls is a 52 y.o. male with multiple significant medical problems who is followed in this office for a history of colon cancer, recently diagnosed universal ulcerative colitis, and chronic left lower quadrant pain. He was last evaluated in the office 10/26/2012. See that dictation for details. At that time, he was asymptomatic after a course of steroids for his ulcerative colitis. He was continued on Lialda 4.8 g daily. Routine followup today. Since his last visit, he reports seeing an endocrinologist for his diabetes mellitus. He tells her that his diabetes is under better control. He is accompanied today by Andrew Walls. He does have some intermittent burning discomfort in the left lower quadrant, quite tolerable. He reports improvement in his bowel habits. A few bowel movements per day which are formed or soft. No mucus or bleeding. No other GI complaints.  REVIEW OF SYSTEMS:  All non-GI ROS negative except for itching, shortness of breath, skin rash  Past Medical History  Diagnosis Date  . Diabetes mellitus 08/03/2011  . Cancer of sigmoid colon, s/p colectomy [id number], pT3pN0 (0/8 LN) 08/03/2011  . Morbid obesity with BMI of 40.0-44.9, adult 08/03/2011  . Diverticulitis of colon 2006,2011,2012,2013    ?really perforated colon cancer  . Steatohepatitis   . Anxiety   . Depression   . Blood transfusion without reported diagnosis     age 70  . Cataract   . COPD (chronic obstructive pulmonary disease)   . Hypertension   . Seizures   . Recurrent boils     Past Surgical History  Procedure Laterality Date  . Abdominal surgery    . Colon surgery  04/23/2011    left colectomy  . Colonoscopy    . Polypectomy    . Fracture left leg    . Fracture left arm    . Tonsillectomy      Social History Andrew Walls  reports that he has never smoked. He has never used smokeless tobacco. He reports that he does not drink alcohol or use illicit drugs.  family  history includes Cancer in his father; Heart disease in his maternal grandmother and mother. There is no history of Colon cancer, Rectal cancer, or Stomach cancer.  Allergies  Allergen Reactions  . Sulfamethoxazole        PHYSICAL EXAMINATION: Vital signs: BP 106/80  Pulse 64  Ht 6\' 2"  (1.88 m)  Wt 312 lb (141.522 kg)  BMI 40.04 kg/m2 General: Well-developed, well-nourished, no acute distress HEENT: Sclerae are anicteric, conjunctiva pink. Oral mucosa intact Lungs: Clear Heart: Regular Abdomen: soft, obese, nontender, nondistended, no obvious ascites, no peritoneal signs, normal bowel sounds. No organomegaly. Prior surgical incisions well-healed Extremities: No edema Psychiatric: alert and oriented x3. Cooperative   ASSESSMENT:  #1. Universal ulcerative colitis. Diagnosed on colonoscopy year 06/22/2012. Doing well on medical therapy #2. Colon cancer status post sigmoid resection, elsewhere, January 2013. #3. Multiple medical problems   PLAN:  #1. Continue Lialda 4.8 g daily. Samples provided. Prescribed. #2. Routine GI followup in 6 months #3. Relook colonoscopy around April 2015

## 2013-03-03 ENCOUNTER — Other Ambulatory Visit: Payer: Self-pay | Admitting: Internal Medicine

## 2013-05-13 ENCOUNTER — Encounter: Payer: Self-pay | Admitting: Internal Medicine

## 2013-05-22 ENCOUNTER — Other Ambulatory Visit: Payer: Self-pay | Admitting: Internal Medicine

## 2013-06-22 ENCOUNTER — Other Ambulatory Visit: Payer: Self-pay | Admitting: Internal Medicine

## 2013-07-28 ENCOUNTER — Ambulatory Visit (INDEPENDENT_AMBULATORY_CARE_PROVIDER_SITE_OTHER): Payer: Medicare Other | Admitting: Neurology

## 2013-07-28 ENCOUNTER — Ambulatory Visit (INDEPENDENT_AMBULATORY_CARE_PROVIDER_SITE_OTHER): Payer: Medicare Other | Admitting: Internal Medicine

## 2013-07-28 ENCOUNTER — Encounter: Payer: Self-pay | Admitting: Neurology

## 2013-07-28 ENCOUNTER — Ambulatory Visit: Payer: Medicare Other | Admitting: Internal Medicine

## 2013-07-28 ENCOUNTER — Encounter: Payer: Self-pay | Admitting: Internal Medicine

## 2013-07-28 VITALS — BP 100/64 | HR 84 | Ht 74.0 in | Wt 302.1 lb

## 2013-07-28 VITALS — BP 105/64 | HR 87 | Temp 97.9°F | Ht 74.0 in | Wt 302.0 lb

## 2013-07-28 DIAGNOSIS — R404 Transient alteration of awareness: Secondary | ICD-10-CM | POA: Insufficient documentation

## 2013-07-28 DIAGNOSIS — K5190 Ulcerative colitis, unspecified, without complications: Secondary | ICD-10-CM

## 2013-07-28 DIAGNOSIS — I2510 Atherosclerotic heart disease of native coronary artery without angina pectoris: Secondary | ICD-10-CM

## 2013-07-28 DIAGNOSIS — R1032 Left lower quadrant pain: Secondary | ICD-10-CM

## 2013-07-28 DIAGNOSIS — R42 Dizziness and giddiness: Secondary | ICD-10-CM | POA: Insufficient documentation

## 2013-07-28 DIAGNOSIS — G8929 Other chronic pain: Secondary | ICD-10-CM

## 2013-07-28 MED ORDER — MESALAMINE 1.2 G PO TBEC: DELAYED_RELEASE_TABLET | ORAL | Status: DC

## 2013-07-28 NOTE — Patient Instructions (Signed)
We have sent the following medications to your pharmacy for you to pick up at your convenience:  Lialda  Please follow up with Dr. Henrene Pastor in 6 months

## 2013-07-28 NOTE — Patient Instructions (Addendum)
1. MRI brain with and without contrast [facility] Tuesday May 12 @7am   2. MRA head without contrast 3. Routine EEG 4. As per [place] driving laws, after an episode of loss of awareness, one should not drive until 6 months event-free

## 2013-07-28 NOTE — Progress Notes (Signed)
HISTORY OF PRESENT ILLNESS:  Andrew Walls is a 53 y.o. male with multiple medical problems as listed below. He has a history of colon cancer for which he underwent sigmoid resection January of 2013 (elsewhere), and universal ulcerative colitis diagnosed on colonoscopy April 2014 when he had complaints of severe diarrhea with mucus and occasional blood. He was last seen in the office 01/11/2013. At that time he was doing well on Lialda 4.8 g daily. Routine followup in 6 months recommended. Plans for surveillance colonoscopy around April 2015. Unfortunately, patient developed symptomatic coronary artery disease for which he underwent coronary artery stent placement in March of 2015. Now on Plavix. Also having problems with dizziness for which he is seeing a neurologist later today. Fortunately, from a GI standpoint, he has been stable. His bowel movements are formed or soft. No mucus or bleeding. Occasional left lower quadrant discomfort (has had chronically) but manageable. He is accompanied by Andrew Walls. Current GI review of systems is otherwise negative.  REVIEW OF SYSTEMS:  All non-GI ROS negative except for sinus and allergy, back pain, visual change, confusion, cough, depression, headaches, hearing problems, heart rhythm change, itching, muscle cramps, night sweats, shortness of breath, skin rash, sleeping problems, excessive thirst and urination  Past Medical History  Diagnosis Date  . Diabetes mellitus 08/03/2011  . Cancer of sigmoid colon, s/p colectomy [id number], pT3pN0 (0/8 LN) 08/03/2011  . Morbid obesity with BMI of 40.0-44.9, adult 08/03/2011  . Diverticulitis of colon 2006,2011,2012,2013    ?really perforated colon cancer  . Steatohepatitis   . Anxiety   . Depression   . Blood transfusion without reported diagnosis     age 39  . Cataract   . COPD (chronic obstructive pulmonary disease)   . Hypertension   . Seizures   . Recurrent boils   . Ulcerative colitis     Past Surgical  History  Procedure Laterality Date  . Abdominal surgery    . Colon surgery  04/23/2011    left colectomy  . Colonoscopy    . Polypectomy    . Fracture left leg    . Fracture left arm    . Tonsillectomy    . Cardiac catheterization      with stent placemnet    Social History Andrew Walls  reports that he has never smoked. He has never used smokeless tobacco. He reports that he does not drink alcohol or use illicit drugs.  family history includes Cancer in his father; Heart disease in his maternal grandmother and mother. There is no history of Colon cancer, Rectal cancer, or Stomach cancer.  Allergies  Allergen Reactions  . Sulfamethoxazole        PHYSICAL EXAMINATION: Vital signs: BP 100/64  Pulse 84  Ht 6\' 2"  (1.88 m)  Wt 302 lb 2 oz (137.043 kg)  BMI 38.77 kg/m2 General: Well-developed, obese, well-nourished, no acute distress HEENT: Sclerae are anicteric, conjunctiva pink. Oral mucosa intact Lungs: Clear Heart: Regular Abdomen: soft, obese, nontender, nondistended, no obvious ascites, no peritoneal signs, normal bowel sounds. No organomegaly. Prior surgical incisions well-healed Extremities: No edema Psychiatric: alert and oriented x3. Cooperative   ASSESSMENT:  #1. Universal ulcerative colitis diagnosed April 2014. In clinical remission on Lialda #2. History of colon cancer status post sigmoid resection elsewhere January 2013 #3. Multiple significant medical problems, including recent coronary artery intervention  PLAN:  #1. Continue Lialda 4.8 g daily. Refill #2. Given current medical issues, postpone routine surveillance colonoscopy scheduled for this time. I recommend  that he return to the office in 6 months for general reassessment
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
## 2013-07-31 ENCOUNTER — Telehealth: Payer: Self-pay | Admitting: Neurology

## 2013-07-31 NOTE — Telephone Encounter (Signed)
faxed

## 2013-07-31 NOTE — Telephone Encounter (Signed)
[facility] hospital called and they need an order for MRI/MRA head faxed to them with a signature to [phone]. She states patient is scheduled for tomorrow.

## 2013-08-16 ENCOUNTER — Ambulatory Visit (INDEPENDENT_AMBULATORY_CARE_PROVIDER_SITE_OTHER): Payer: Medicare Other | Admitting: Neurology

## 2013-08-16 DIAGNOSIS — R42 Dizziness and giddiness: Secondary | ICD-10-CM

## 2013-08-16 DIAGNOSIS — R404 Transient alteration of awareness: Secondary | ICD-10-CM

## 2013-08-24 NOTE — Procedures (Signed)
ELECTROENCEPHALOGRAM REPORT  Date of Study: 08/16/2013  Patient's Name: Andrew Walls MRN: [id number] Date of Birth: 04-22-1960  Referring Provider: Dr. Ellouise Newer  Clinical History: This is a 53 year old man with a history of hypertension, hyperlipidemia, diabetes, colon cancer s/p colectomy, CAD s/p stent, presenting with recurrent episodes of dizziness with a sensation of unsteadiness, followed by decreased responsiveness.   Medications: Neurontin, Prozac, OxyContin, Synthroid, metoprolol, Plavix, aspirin, Avapro, Norco  Technical Summary: A multichannel digital EEG recording measured by the international 10-20 system with electrodes applied with paste and impedances below 5000 ohms performed in our laboratory with EKG monitoring in an awake and asleep patient.  Hyperventilation and photic stimulation were performed.  The digital EEG was referentially recorded, reformatted, and digitally filtered in a variety of bipolar and referential montages for optimal display.  Spike detection software was employed.  Description: The patient is awake and asleep during the recording.  During maximal wakefulness, there is a symmetric, medium voltage 10 Hz posterior dominant rhythm that attenuates with eye opening.  The record is symmetric.  During drowsiness and sleep, there is an increase in theta slowing of the background.  Vertex waves and symmetric sleep spindles were seen.  Hyperventilation and photic stimulation did not elicit any abnormalities.  There were no epileptiform discharges or electrographic seizures seen.    EKG lead was unremarkable.  Impression: This awake and asleep EEG is normal.    Clinical Correlation: A normal EEG does not exclude a clinical diagnosis of epilepsy.  If further clinical questions remain, prolonged EEG may be helpful.  Clinical correlation is advised.   Ellouise Newer, M.D.

## 2013-08-24 NOTE — Progress Notes (Signed)
Patient came in for EEG. See Procedure notes for EEG results.  

## 2013-09-08 ENCOUNTER — Other Ambulatory Visit: Payer: Self-pay | Admitting: *Deleted

## 2013-09-08 DIAGNOSIS — R42 Dizziness and giddiness: Secondary | ICD-10-CM

## 2013-09-08 DIAGNOSIS — R404 Transient alteration of awareness: Secondary | ICD-10-CM

## 2013-09-12 ENCOUNTER — Other Ambulatory Visit (HOSPITAL_COMMUNITY): Payer: Medicare Other

## 2013-09-25 ENCOUNTER — Other Ambulatory Visit (HOSPITAL_COMMUNITY): Payer: Medicare Other

## 2013-09-25 ENCOUNTER — Ambulatory Visit (HOSPITAL_COMMUNITY)
Admission: RE | Admit: 2013-09-25 | Discharge: 2013-09-25 | Disposition: A | Discharge status: Home / Self Care | Payer: Medicare Other | Source: Ambulatory Visit | Attending: Neurology | Admitting: Neurology

## 2013-09-25 DIAGNOSIS — M549 Dorsalgia, unspecified: Secondary | ICD-10-CM | POA: Insufficient documentation

## 2013-09-25 DIAGNOSIS — R404 Transient alteration of awareness: Secondary | ICD-10-CM

## 2013-09-25 DIAGNOSIS — R51 Headache: Secondary | ICD-10-CM | POA: Diagnosis not present

## 2013-09-25 DIAGNOSIS — R569 Unspecified convulsions: Secondary | ICD-10-CM

## 2013-09-25 DIAGNOSIS — R42 Dizziness and giddiness: Secondary | ICD-10-CM | POA: Insufficient documentation

## 2013-09-27 ENCOUNTER — Ambulatory Visit (INDEPENDENT_AMBULATORY_CARE_PROVIDER_SITE_OTHER): Payer: Medicare Other | Admitting: Neurology

## 2013-09-27 ENCOUNTER — Encounter: Payer: Self-pay | Admitting: Neurology

## 2013-09-27 VITALS — BP 140/76 | HR 103 | Ht 74.0 in | Wt 300.0 lb

## 2013-09-27 DIAGNOSIS — I2510 Atherosclerotic heart disease of native coronary artery without angina pectoris: Secondary | ICD-10-CM

## 2013-09-27 DIAGNOSIS — R404 Transient alteration of awareness: Secondary | ICD-10-CM | POA: Diagnosis not present

## 2013-09-27 DIAGNOSIS — R42 Dizziness and giddiness: Secondary | ICD-10-CM | POA: Diagnosis not present

## 2013-09-27 MED ORDER — LAMOTRIGINE ER 25 MG PO TB24: ORAL_TABLET | ORAL | Status: DC
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
## 2013-09-27 NOTE — Patient Instructions (Addendum)
1. Start Lamotrigine ER 25mg : Take 1 tab daily for 2 weeks, then increase to 2 tabs daily for 2 weeks, then increase to 4 tabs daily and continue 2. Keep a calendar of your symptoms, especially the staring episodes 3. Follow-up in 6 weeks 4. ENT referral for ear fullness [facility])

## 2013-10-02 ENCOUNTER — Telehealth: Payer: Self-pay | Admitting: Neurology

## 2013-10-02 NOTE — Telephone Encounter (Signed)
Pt called f/u on the lab report that was suppose to be sent to him and also an appointment with Dr. Redmond Pulling in [place]. Please call pt to confirm. C/b [phone]

## 2013-10-03 NOTE — Procedures (Signed)
ELECTROENCEPHALOGRAM REPORT  Dates of Recording: 09/25/2013 to 09/26/2013  Patient's Name: Andrew Walls MRN: [id number] Date of Birth: 12/14/1960  Referring Provider: Dr. Ellouise Newer  Procedure: 24-hour ambulatory EEG  History:This is a 53 year old man with recurrent episodes of dizziness with a sensation of unsteadiness, followed by decreased responsiveness.   Medications: Neurontin, Prozac, OxyContin, Synthroid, metoprolol, Plavix, aspirin, Avapro, Norco  Technical Summary: This is a 24-hour multichannel digital EEG recording measured by the international 10-20 system with electrodes applied with paste and impedances below 5000 ohms performed as portable with EKG monitoring.  The digital EEG was referentially recorded, reformatted, and digitally filtered in a variety of bipolar and referential montages for optimal display.    DESCRIPTION OF RECORDING: During maximal wakefulness, the background activity consisted of a symmetric 10.5 Hz posterior dominant rhythm which was reactive to eye opening.  There were no epileptiform discharges or focal slowing seen in wakefulness.  During the recording, the patient progresses through wakefulness, drowsiness, and Stage 2 sleep.  Again, there were no epileptiform discharges seen.  Events: On 7/6 at 1230 hours, patient reports headache. Electrographically, there were no EEG or EKG changes seen.  On 7/6  at 1720 hours, patient reports he came back in the house and everything went black. Electrographically, there were no EEG or EKG changes seen.  On 7/6 at 1930 hours, patient reports headache and feeling short of breath. Electrographically, there were no EEG or EKG changes seen.  On 7/6 at 2030 hours, patient reports going to the bathroom and things got black and dizzy. Electrographically, there were no EEG or EKG changes seen.  On 7/6 at 2300 hours, patient reports continued headache, dizziness, backache, feeling nervous and achy.  Electrographically, there were no EEG or EKG changes seen  On 7/7 at the 0730 hours, he reports that head is still hurting and ears are stopped up. Electrographically, there were no EEG or EKG changes seen.   There were no electrographic seizures seen.  EKG lead was unremarkable.  IMPRESSION: This 24-hour ambulatory EEG study is normal.  Episodes of things going black, headaches, dizziness, backache, and feeling nervous, did not show electrographic correlate, indicating these are non-epileptic.  CLINICAL CORRELATION: A normal EEG does not exclude a clinical diagnosis of epilepsy. Typical episodes of staring were not captured. Clinical correlation is advised.   Ellouise Newer, M.D.

## 2013-10-04 ENCOUNTER — Encounter: Payer: Self-pay | Admitting: Neurology

## 2013-10-09 ENCOUNTER — Telehealth: Payer: Self-pay | Admitting: Neurology

## 2013-10-09 NOTE — Telephone Encounter (Signed)
Spoke with patient advised him that his pcp has to make the referral due to him having medicaid

## 2013-10-09 NOTE — Telephone Encounter (Signed)
Spoke w/ pt, he is following up on phone call made to our office on 10/02/13. Says he never received a return call regarding a referral to Dr. Redmond Pulling in [place]. He has since called his PCP today and asked them to contact Dr. Redmond Pulling. I apologized to patient and advised we would return his call today regarding the appt w/ Dr. Redmond Pulling. Please follow up with the PCP or Dr. Dois Davenport to determine if the appt has been scheduled already. If not, please schedule appt and contact pt ASAP. Contact number is [phone] / Sherri S.

## 2013-10-09 NOTE — Telephone Encounter (Signed)
Patient notified

## 2013-10-09 NOTE — Telephone Encounter (Signed)
Pt wants to speak with Sherri please call [phone]

## 2013-11-08 ENCOUNTER — Telehealth: Payer: Self-pay | Admitting: Neurology

## 2013-11-08 ENCOUNTER — Ambulatory Visit: Payer: Medicare Other | Admitting: Neurology

## 2013-11-08 NOTE — Telephone Encounter (Signed)
Pt states that he does not have a ride and will not be able to come to appt today and will call back to resch appt

## 2013-11-10 ENCOUNTER — Encounter: Payer: Self-pay | Admitting: Internal Medicine

## 2013-11-22 ENCOUNTER — Other Ambulatory Visit: Payer: Self-pay | Admitting: Internal Medicine

## 2013-11-23 NOTE — Telephone Encounter (Signed)
Left message for patient - requesting Lomotil refill which he has not had in a while and was not mentioned in most recent office note.  LM asking if he was having any problems Dr. Henrene Pastor needed to know about before I sent a message to Dr. Henrene Pastor asking permission to refill Lomotil

## 2013-12-21 ENCOUNTER — Other Ambulatory Visit: Payer: Self-pay | Admitting: Family Medicine

## 2013-12-21 MED ORDER — LAMOTRIGINE ER 100 MG PO TB24: 100.0000 mg | ORAL_TABLET | Freq: Every day | ORAL | Status: DC

## 2014-02-07 ENCOUNTER — Ambulatory Visit: Payer: Medicare Other | Admitting: Internal Medicine

## 2014-02-28 ENCOUNTER — Encounter (HOSPITAL_COMMUNITY): Payer: Self-pay

## 2014-02-28 ENCOUNTER — Emergency Department (HOSPITAL_COMMUNITY): Payer: Medicare Other

## 2014-02-28 ENCOUNTER — Emergency Department (HOSPITAL_COMMUNITY)
Admission: EM | Admit: 2014-02-28 | Discharge: 2014-02-28 | Disposition: A | Discharge status: Home / Self Care | Payer: Medicare Other | Attending: Emergency Medicine | Admitting: Emergency Medicine

## 2014-02-28 DIAGNOSIS — I10 Essential (primary) hypertension: Secondary | ICD-10-CM | POA: Insufficient documentation

## 2014-02-28 DIAGNOSIS — F419 Anxiety disorder, unspecified: Secondary | ICD-10-CM | POA: Insufficient documentation

## 2014-02-28 DIAGNOSIS — E119 Type 2 diabetes mellitus without complications: Secondary | ICD-10-CM | POA: Insufficient documentation

## 2014-02-28 DIAGNOSIS — Z7952 Long term (current) use of systemic steroids: Secondary | ICD-10-CM | POA: Diagnosis not present

## 2014-02-28 DIAGNOSIS — S3992XA Unspecified injury of lower back, initial encounter: Secondary | ICD-10-CM | POA: Insufficient documentation

## 2014-02-28 DIAGNOSIS — Y998 Other external cause status: Secondary | ICD-10-CM | POA: Diagnosis not present

## 2014-02-28 DIAGNOSIS — Y9389 Activity, other specified: Secondary | ICD-10-CM | POA: Insufficient documentation

## 2014-02-28 DIAGNOSIS — Z79899 Other long term (current) drug therapy: Secondary | ICD-10-CM | POA: Diagnosis not present

## 2014-02-28 DIAGNOSIS — Y9289 Other specified places as the place of occurrence of the external cause: Secondary | ICD-10-CM | POA: Diagnosis not present

## 2014-02-28 DIAGNOSIS — Z7982 Long term (current) use of aspirin: Secondary | ICD-10-CM | POA: Insufficient documentation

## 2014-02-28 DIAGNOSIS — S0990XA Unspecified injury of head, initial encounter: Secondary | ICD-10-CM | POA: Insufficient documentation

## 2014-02-28 DIAGNOSIS — J449 Chronic obstructive pulmonary disease, unspecified: Secondary | ICD-10-CM | POA: Insufficient documentation

## 2014-02-28 DIAGNOSIS — H269 Unspecified cataract: Secondary | ICD-10-CM | POA: Diagnosis not present

## 2014-02-28 DIAGNOSIS — E860 Dehydration: Secondary | ICD-10-CM | POA: Diagnosis not present

## 2014-02-28 DIAGNOSIS — F329 Major depressive disorder, single episode, unspecified: Secondary | ICD-10-CM | POA: Diagnosis not present

## 2014-02-28 DIAGNOSIS — W1830XA Fall on same level, unspecified, initial encounter: Secondary | ICD-10-CM | POA: Insufficient documentation

## 2014-02-28 DIAGNOSIS — Z8719 Personal history of other diseases of the digestive system: Secondary | ICD-10-CM | POA: Diagnosis not present

## 2014-02-28 DIAGNOSIS — G40909 Epilepsy, unspecified, not intractable, without status epilepticus: Secondary | ICD-10-CM | POA: Diagnosis not present

## 2014-02-28 DIAGNOSIS — W19XXXA Unspecified fall, initial encounter: Secondary | ICD-10-CM

## 2014-02-28 DIAGNOSIS — Z7902 Long term (current) use of antithrombotics/antiplatelets: Secondary | ICD-10-CM | POA: Insufficient documentation

## 2014-02-28 DIAGNOSIS — S199XXA Unspecified injury of neck, initial encounter: Secondary | ICD-10-CM | POA: Diagnosis not present

## 2014-02-28 DIAGNOSIS — R55 Syncope and collapse: Secondary | ICD-10-CM

## 2014-02-28 DIAGNOSIS — Z794 Long term (current) use of insulin: Secondary | ICD-10-CM | POA: Insufficient documentation

## 2014-02-28 DIAGNOSIS — Z85038 Personal history of other malignant neoplasm of large intestine: Secondary | ICD-10-CM | POA: Diagnosis not present

## 2014-02-28 DIAGNOSIS — Z872 Personal history of diseases of the skin and subcutaneous tissue: Secondary | ICD-10-CM | POA: Insufficient documentation

## 2014-02-28 LAB — HEPATIC FUNCTION PANEL
ALK PHOS: 111 U/L (ref 39–117)
ALT: 25 U/L (ref 0–53)
AST: 21 U/L (ref 0–37)
Albumin: 3.7 g/dL (ref 3.5–5.2)
Total Bilirubin: 1.2 mg/dL (ref 0.3–1.2)
Total Protein: 7.6 g/dL (ref 6.0–8.3)

## 2014-02-28 LAB — URINALYSIS, ROUTINE W REFLEX MICROSCOPIC
Bilirubin Urine: NEGATIVE
Glucose, UA: >1000 mg/dL — AB
HGB URINE DIPSTICK: NEGATIVE
KETONES UR: NEGATIVE mg/dL
Leukocytes, UA: NEGATIVE
NITRITE: NEGATIVE
Protein, ur: NEGATIVE mg/dL
Specific Gravity, Urine: 1.015 (ref 1.005–1.030)
UROBILINOGEN UA: 0.2 mg/dL (ref 0.0–1.0)
pH: 5.5 (ref 5.0–8.0)

## 2014-02-28 LAB — CBC WITH DIFFERENTIAL/PLATELET
BASOS PCT: 0 % (ref 0–1)
Basophils Absolute: 0 10*3/uL (ref 0.0–0.1)
Eosinophils Absolute: 0.2 10*3/uL (ref 0.0–0.7)
Eosinophils Relative: 3 % (ref 0–5)
HCT: 40.3 % (ref 39.0–52.0)
Hemoglobin: 13.2 g/dL (ref 13.0–17.0)
LYMPHS ABS: 1 10*3/uL (ref 0.7–4.0)
Lymphocytes Relative: 11 % — ABNORMAL LOW (ref 12–46)
MCH: 27.6 pg (ref 26.0–34.0)
MCHC: 32.8 g/dL (ref 30.0–36.0)
MCV: 84.1 fL (ref 78.0–100.0)
MONO ABS: 0.7 10*3/uL (ref 0.1–1.0)
Monocytes Relative: 9 % (ref 3–12)
Neutro Abs: 6.6 10*3/uL (ref 1.7–7.7)
Neutrophils Relative %: 77 % (ref 43–77)
Platelets: 245 10*3/uL (ref 150–400)
RBC: 4.79 MIL/uL (ref 4.22–5.81)
RDW: 14.2 % (ref 11.5–15.5)
WBC: 8.5 10*3/uL (ref 4.0–10.5)

## 2014-02-28 LAB — TROPONIN I: Troponin I: <0.3 ng/mL (ref <0.30)

## 2014-02-28 LAB — BASIC METABOLIC PANEL
Anion gap: 18 — ABNORMAL HIGH (ref 5–15)
BUN: 18 mg/dL (ref 6–23)
CHLORIDE: 100 meq/L (ref 96–112)
CO2: 20 mEq/L (ref 19–32)
CREATININE: 1.42 mg/dL — AB (ref 0.50–1.35)
Calcium: 9.8 mg/dL (ref 8.4–10.5)
GFR calc non Af Amer: 54 mL/min — ABNORMAL LOW (ref >90)
GFR, EST AFRICAN AMERICAN: 63 mL/min — AB (ref >90)
GLUCOSE: 250 mg/dL — AB (ref 70–99)
Potassium: 4.3 mEq/L (ref 3.7–5.3)
Sodium: 138 mEq/L (ref 137–147)

## 2014-02-28 LAB — URINE MICROSCOPIC-ADD ON

## 2014-02-28 MED ORDER — SODIUM CHLORIDE 0.9 % IV BOLUS (SEPSIS)
500.0000 mL | Freq: Once | INTRAVENOUS | Status: AC
  Administered 2014-02-28: 500 mL via INTRAVENOUS

## 2014-02-28 NOTE — ED Provider Notes (Signed)
CSN: [id number]     Arrival date & time 02/28/14  1502 History  This chart was scribed for Andrew Walls, * by Stephania Fragmin, ED Scribe. This patient was seen in room APA10/APA10 and the patient's care was started at 3:17 PM.    Chief Complaint  Patient presents with  . Near Syncope   The history is provided by the patient. No language interpreter was used.     HPI Comments: Andrew Walls is a 53 y.o. male who presents to the Emergency Department complaining of near syncope that occurred earlier today when he felt dizzy and lightheaded in the restroom at the doctor's office. Per nursing notes, he fell but doesn't think he went completely out. Patient complains of associated head, neck, and back pain. He denies abdominal pain.    Past Medical History  Diagnosis Date  . Diabetes mellitus 08/03/2011  . 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 Morbid obesity with BMI of 40.0-44.9, adult 08/03/2011  . Diverticulitis of colon 2006,2011,2012,2013    ?really perforated colon cancer  . Steatohepatitis   . Anxiety   . Depression   . Blood transfusion without reported diagnosis     age 52  . Cataract   . COPD (chronic obstructive pulmonary disease)   . Hypertension   . Seizures   . Recurrent boils   . Ulcerative colitis    Past Surgical History  Procedure Laterality Date  . Abdominal surgery    . Colon surgery  04/23/2011    left colectomy  . Colonoscopy    . Polypectomy    . Fracture left leg    . Fracture left arm    . Tonsillectomy    . Cardiac catheterization      with stent placemnet   Family History  Problem Relation Age of Onset  . Heart disease Mother   . Cancer Father     melanoma  . Heart disease Maternal Grandmother   . Colon cancer Neg Hx   . Rectal cancer Neg Hx   . Stomach cancer Neg Hx    History  Substance Use Topics  . Smoking status: Never Smoker   . Smokeless tobacco: Never Used  . Alcohol Use: No     Review of Systems  Musculoskeletal: Positive for back pain and neck pain.  Neurological: Positive for dizziness, light-headedness and headaches.  All other systems reviewed and are negative.     Allergies  Sulfamethoxazole  Home Medications   Prior to Admission medications   Medication Sig Start Date End Date Taking? Authorizing Provider  albuterol (VENTOLIN HFA) 108 (90 BASE) MCG/ACT inhaler Inhale 2 puffs into the lungs every 6 (six) hours as needed.   Yes Historical Provider, MD  aspirin EC 81 MG tablet Take 81 mg by mouth daily.   Yes Historical Provider, MD  atorvastatin (LIPITOR) 10 MG tablet Take 10 mg by mouth at bedtime.    Yes Historical Provider, MD  clopidogrel (PLAVIX) 75 MG tablet Take 75 mg by mouth daily with breakfast.   Yes Historical Provider, MD  fexofenadine (ALLEGRA) 180 MG tablet Take 180 mg by mouth 2 (two) times daily.   Yes Historical Provider, MD  FLUoxetine (PROZAC) 20 MG capsule Take 20 mg by mouth daily.   Yes Historical Provider, MD  gabapentin (NEURONTIN) 400 MG capsule Take 400 mg by mouth at bedtime.   Yes Historical Provider, MD  halobetasol (ULTRAVATE) 0.05 % cream Apply 1 application topically  daily as needed (for irritation).   Yes Historical Provider, MD  Insulin Lispro, Human, (HUMALOG KWIKPEN [place]) Inject 5-12 Units into the skin 3 (three) times daily before meals. Sliding Scale 90-150 = 5 units  151-200= 6 units 201-250= 7 units 251-300= 8 units 301-350= 9 units 351-400= 10 units As directed per sliding scale instructions   Yes Historical Provider, MD  INVOKANA 100 MG TABS Take 100 mg by mouth daily.  12/21/12  Yes Historical Provider, MD  irbesartan (AVAPRO) 75 MG tablet Take 75 mg by mouth daily.   Yes Historical Provider, MD  isosorbide dinitrate (ISORDIL) 30 MG tablet Take 30 mg by mouth daily.   Yes Historical Provider, MD  LamoTRIgine 100 MG TB24 Take 1 tablet (100 mg total) by mouth daily. 12/21/13  Yes Cameron Sprang, MD  LANTUS  SOLOSTAR 100 UNIT/ML SOPN Inject 40 Units into the skin at bedtime.  11/18/12  Yes Historical Provider, MD  levothyroxine (SYNTHROID, LEVOTHROID) 25 MCG tablet Take 25 mcg by mouth daily before breakfast.   Yes Historical Provider, MD  LIALDA 1.2 G EC tablet Take 2 tablets by mouth twice daily 01/23/14  Yes Irene Shipper, MD  Liraglutide (VICTOZA) 18 MG/3ML SOPN Inject 1.8 Units into the skin daily.    Yes Historical Provider, MD  metoprolol succinate (TOPROL-XL) 25 MG 24 hr tablet Take 25 mg by mouth daily. 02/24/14  Yes Historical Provider, MD  triamcinolone cream (KENALOG) 0.1 % Apply 1 application topically 2 (two) times daily.   Yes Historical Provider, MD  nitroGLYCERIN (NITROSTAT) 0.4 MG SL tablet Place 0.4 mg under the tongue every 5 (five) minutes as needed for chest pain.    Historical Provider, MD   BP 109/86 mmHg  Pulse 88  Temp(Src) 97.7 F (36.5 C) (Oral)  Resp 20  Ht 6\' 1"  (1.854 m)  Wt 302 lb (136.986 kg)  BMI 39.85 kg/m2  SpO2 100% Physical Exam  Constitutional: He is oriented to person, place, and time. He appears well-developed and well-nourished. No distress.  HENT:  Head: Normocephalic and atraumatic.  Right Ear: Hearing normal.  Left Ear: Hearing normal.  Nose: Nose normal.  Mouth/Throat: Oropharynx is clear and moist and mucous membranes are normal.  Eyes: Conjunctivae and EOM are normal. Pupils are equal, round, and reactive to light.  Neck: Normal range of motion. Neck supple.  Cardiovascular: Regular rhythm, S1 normal and S2 normal.  Exam reveals no gallop and no friction rub.   No murmur heard. Pulmonary/Chest: Effort normal and breath sounds normal. No respiratory distress. He exhibits no tenderness.  Abdominal: Soft. Normal appearance and bowel sounds are normal. There is no hepatosplenomegaly. There is no tenderness. There is no rebound, no guarding, no tenderness at McBurney's point and negative Murphy's sign. No hernia.  Musculoskeletal: Normal range of  motion.  Right paraspinal tenderness on the back.  Neurological: He is alert and oriented to person, place, and time. He has normal strength. No cranial nerve deficit or sensory deficit. Coordination normal. GCS eye subscore is 4. GCS verbal subscore is 5. GCS motor subscore is 6.  Skin: Skin is warm, dry and intact. No rash noted. No cyanosis.  Psychiatric: He has a normal mood and affect. His speech is normal and behavior is normal. Thought content normal.  Nursing note and vitals reviewed.   ED Course  Procedures (including critical care time)  DIAGNOSTIC STUDIES: Oxygen Saturation is 100% on room air, normal by my interpretation.     Labs Review Labs  Reviewed  CBC WITH DIFFERENTIAL - Abnormal; Notable for the following:    Lymphocytes Relative 11 (*)    All other components within normal limits  BASIC METABOLIC PANEL - Abnormal; Notable for the following:    Glucose, Bld 250 (*)    Creatinine, Ser 1.42 (*)    GFR calc non Af Amer 54 (*)    GFR calc Af Amer 63 (*)    Anion gap 18 (*)    All other components within normal limits  TROPONIN I  HEPATIC FUNCTION PANEL  URINALYSIS, ROUTINE W REFLEX MICROSCOPIC    Imaging Review Dg Lumbar Spine Complete  02/28/2014   CLINICAL DATA:  53 year old male status post fall in restroom with pain. Initial encounter.  EXAM: LUMBAR SPINE - COMPLETE 4+ VIEW  COMPARISON:  [facility] lumbar series 10/14/2013 and earlier.  FINDINGS: Bone mineralization is within normal limits. Normal lumbar segmentation. Stable lumbar vertebral height and alignment. Chronic endplate spurring most pronounced at L2-L3. No pars fracture. sacral ala and SI joints appear stable and intact.  IMPRESSION: No acute fracture or listhesis identified in the lumbar spine.   Electronically Signed   By: Lars Pinks M.D.   On: 02/28/2014 16:43   Ct Head Wo Contrast  02/28/2014   CLINICAL DATA:  Posttraumatic headache and neck pain after fall. Uncertain if there was  loss of consciousness.  EXAM: CT HEAD WITHOUT CONTRAST  CT CERVICAL SPINE WITHOUT CONTRAST  TECHNIQUE: Multidetector CT imaging of the head and cervical spine was performed following the standard protocol without intravenous contrast. Multiplanar CT image reconstructions of the cervical spine were also generated.  COMPARISON:  CT scan of October 14, 2013.  FINDINGS: CT HEAD FINDINGS  Bony calvarium appears intact. No mass effect or midline shift is noted. Ventricular size is within normal limits. There is no evidence of mass lesion, hemorrhage or acute infarction.  CT CERVICAL SPINE FINDINGS  No fracture or spondylolisthesis is noted. Disc spaces appear well maintained. Posterior facet joints appear normal. Degenerative changes seen involving the predental space of C1-2. Posterior osteophyte is noted at C3-4.  IMPRESSION: Normal head CT.  Mild degenerative changes are noted. No significant abnormality seen in the cervical spine.   Electronically Signed   By: Sabino Dick M.D.   On: 02/28/2014 16:19   Ct Cervical Spine Wo Contrast  02/28/2014   CLINICAL DATA:  Posttraumatic headache and neck pain after fall. Uncertain if there was loss of consciousness.  EXAM: CT HEAD WITHOUT CONTRAST  CT CERVICAL SPINE WITHOUT CONTRAST  TECHNIQUE: Multidetector CT imaging of the head and cervical spine was performed following the standard protocol without intravenous contrast. Multiplanar CT image reconstructions of the cervical spine were also generated.  COMPARISON:  CT scan of October 14, 2013.  FINDINGS: CT HEAD FINDINGS  Bony calvarium appears intact. No mass effect or midline shift is noted. Ventricular size is within normal limits. There is no evidence of mass lesion, hemorrhage or acute infarction.  CT CERVICAL SPINE FINDINGS  No fracture or spondylolisthesis is noted. Disc spaces appear well maintained. Posterior facet joints appear normal. Degenerative changes seen involving the predental space of C1-2. Posterior osteophyte  is noted at C3-4.  IMPRESSION: Normal head CT.  Mild degenerative changes are noted. No significant abnormality seen in the cervical spine.   Electronically Signed   By: Sabino Dick M.D.   On: 02/28/2014 16:19     EKG Interpretation   Date/Time:  Wednesday February 28 2014 15:30:32 EST  Ventricular Rate:  88 PR Interval:  177 QRS Duration: 87 QT Interval:  353 QTC Calculation: 427 R Axis:   37 Text Interpretation:  Sinus rhythm Abnormal R-wave progression, early  transition No significant change since last tracing Confirmed by Tykel Badie   MD, [place] [phone]) on 02/28/2014 3:33:41 PM      MDM   Final diagnoses:  Fall   syncope  Patient presents to the ER for evaluation after either a near syncope or syncope episode. Patient reports that this has happened many times in the past. He was at the endocrinologist when he became dizzy. He got up to go to the bathroom and fell. He tells me that he did not pass out, but it's not clear. She brought by ambulance. He is complaining of headache, neck pain, lower back pain after the fall.  CT head, cervical spine are negative. Lumbar spine x-ray is negative. Blood work is unremarkable. Patient does have an elevated creatinine, likely has some element of dehydration. Blood pressure was low at the doctor's office when he had his presyncope/syncope. Patient to minister to IV fluids here in the ER. As this has happened previously, no focal neurologic findings, workup negative, patient can be discharged, follow-up with his doctor.  I personally performed the services described in this documentation, which was scribed in my presence. The recorded information has been reviewed and is accurate.      Andrew Greek, MD 02/28/14 1719

## 2014-02-28 NOTE — ED Notes (Signed)
Pt reports went to Endocrinologist's office today and became dizzy in the waiting room.  Reports went to the restroom and passed out while standing washing his hands.  Pt says he fell but doesn't think he went completely out.  Says doesn't think he hit his head but co headache and neck pain after the fall.  Pt has scabs all over body, pt reports his picks at them.  CBG was 258 per ems.  Office reports bp was 93/61 and HR 85.  EMS obtaine bp of 121/79.

## 2014-02-28 NOTE — Discharge Instructions (Signed)
Dehydration, Adult Dehydration is when you lose more fluids from the body than you take in. Vital organs like the kidneys, brain, and heart cannot function without a proper amount of fluids and salt. Any loss of fluids from the body can cause dehydration.  CAUSES   Vomiting.  Diarrhea.  Excessive sweating.  Excessive urine output.  Fever. SYMPTOMS  Mild dehydration  Thirst.  Dry lips.  Slightly dry mouth. Moderate dehydration  Very dry mouth.  Sunken eyes.  Skin does not bounce back quickly when lightly pinched and released.  Dark urine and decreased urine production.  Decreased tear production.  Headache. Severe dehydration  Very dry mouth.  Extreme thirst.  Rapid, weak pulse (more than 100 beats per minute at rest).  Cold hands and feet.  Not able to sweat in spite of heat and temperature.  Rapid breathing.  Blue lips.  Confusion and lethargy.  Difficulty being awakened.  Minimal urine production.  No tears. DIAGNOSIS  Your caregiver will diagnose dehydration based on your symptoms and your exam. Blood and urine tests will help confirm the diagnosis. The diagnostic evaluation should also identify the cause of dehydration. TREATMENT  Treatment of mild or moderate dehydration can often be done at home by increasing the amount of fluids that you drink. It is best to drink small amounts of fluid more often. Drinking too much at one time can make vomiting worse. Refer to the home care instructions below. Severe dehydration needs to be treated at the hospital where you will probably be given intravenous (IV) fluids that contain water and electrolytes. HOME CARE INSTRUCTIONS   Ask your caregiver about specific rehydration instructions.  Drink enough fluids to keep your urine clear or pale yellow.  Drink small amounts frequently if you have nausea and vomiting.  Eat as you normally do.  Avoid:  Foods or drinks high in sugar.  Carbonated  drinks.  Juice.  Extremely hot or cold fluids.  Drinks with caffeine.  Fatty, greasy foods.  Alcohol.  Tobacco.  Overeating.  Gelatin desserts.  Wash your hands well to avoid spreading bacteria and viruses.  Only take over-the-counter or prescription medicines for pain, discomfort, or fever as directed by your caregiver.  Ask your caregiver if you should continue all prescribed and over-the-counter medicines.  Keep all follow-up appointments with your caregiver. SEEK MEDICAL CARE IF:  You have abdominal pain and it increases or stays in one area (localizes).  You have a rash, stiff neck, or severe headache.  You are irritable, sleepy, or difficult to awaken.  You are weak, dizzy, or extremely thirsty. SEEK IMMEDIATE MEDICAL CARE IF:   You are unable to keep fluids down or you get worse despite treatment.  You have frequent episodes of vomiting or diarrhea.  You have blood or green matter (bile) in your vomit.  You have blood in your stool or your stool looks black and tarry.  You have not urinated in 6 to 8 hours, or you have only urinated a small amount of very dark urine.  You have a fever.  You faint. MAKE SURE YOU:   Understand these instructions.  Will watch your condition.  Will get help right away if you are not doing well or get worse. Document Released: 03/09/2005 Document Revised: 06/01/2011 Document Reviewed: 10/27/2010 Exit[employer] Patient Information 2015 [employer]. This information is not intended to replace advice given to you by your health care provider. Make sure you discuss any questions you have with your health care   provider.  

## 2014-03-26 ENCOUNTER — Other Ambulatory Visit: Payer: Self-pay | Admitting: Internal Medicine

## 2014-04-06 ENCOUNTER — Other Ambulatory Visit (INDEPENDENT_AMBULATORY_CARE_PROVIDER_SITE_OTHER): Payer: Medicare Other

## 2014-04-06 ENCOUNTER — Ambulatory Visit (INDEPENDENT_AMBULATORY_CARE_PROVIDER_SITE_OTHER): Payer: Medicare Other | Admitting: Internal Medicine

## 2014-04-06 ENCOUNTER — Encounter: Payer: Self-pay | Admitting: Internal Medicine

## 2014-04-06 VITALS — BP 106/58 | HR 84 | Ht 74.0 in | Wt 300.0 lb

## 2014-04-06 DIAGNOSIS — R1084 Generalized abdominal pain: Secondary | ICD-10-CM

## 2014-04-06 DIAGNOSIS — K5190 Ulcerative colitis, unspecified, without complications: Secondary | ICD-10-CM

## 2014-04-06 DIAGNOSIS — C187 Malignant neoplasm of sigmoid colon: Secondary | ICD-10-CM

## 2014-04-06 LAB — COMPREHENSIVE METABOLIC PANEL
ALK PHOS: 106 U/L (ref 39–117)
ALT: 28 U/L (ref 0–53)
AST: 25 U/L (ref 0–37)
Albumin: 4 g/dL (ref 3.5–5.2)
BILIRUBIN TOTAL: 0.7 mg/dL (ref 0.2–1.2)
BUN: 21 mg/dL (ref 6–23)
CHLORIDE: 103 meq/L (ref 96–112)
CO2: 24 meq/L (ref 19–32)
Calcium: 9.6 mg/dL (ref 8.4–10.5)
Creatinine, Ser: 1.38 mg/dL (ref 0.40–1.50)
GFR: 56.81 mL/min — ABNORMAL LOW (ref >60.00)
Glucose, Bld: 252 mg/dL — ABNORMAL HIGH (ref 70–99)
Potassium: 4.5 mEq/L (ref 3.5–5.1)
Sodium: 136 mEq/L (ref 135–145)
TOTAL PROTEIN: 7.7 g/dL (ref 6.0–8.3)

## 2014-04-06 LAB — CBC WITH DIFFERENTIAL/PLATELET
Basophils Absolute: 0 10*3/uL (ref 0.0–0.1)
Basophils Relative: 0.3 % (ref 0.0–3.0)
EOS ABS: 0.4 10*3/uL (ref 0.0–0.7)
EOS PCT: 3.5 % (ref 0.0–5.0)
HCT: 42.4 % (ref 39.0–52.0)
HEMOGLOBIN: 13.6 g/dL (ref 13.0–17.0)
Lymphocytes Relative: 13.7 % (ref 12.0–46.0)
Lymphs Abs: 1.4 10*3/uL (ref 0.7–4.0)
MCHC: 32 g/dL (ref 30.0–36.0)
MCV: 83 fl (ref 78.0–100.0)
MONOS PCT: 6.3 % (ref 3.0–12.0)
Monocytes Absolute: 0.6 10*3/uL (ref 0.1–1.0)
Neutro Abs: 7.7 10*3/uL (ref 1.4–7.7)
Neutrophils Relative %: 76.2 % (ref 43.0–77.0)
Platelets: 340 10*3/uL (ref 150.0–400.0)
RBC: 5.11 Mil/uL (ref 4.22–5.81)
RDW: 15.1 % (ref 11.5–15.5)
WBC: 10.2 10*3/uL (ref 4.0–10.5)

## 2014-04-06 MED ORDER — MESALAMINE 1.2 G PO TBEC: DELAYED_RELEASE_TABLET | ORAL | Status: AC

## 2014-04-06 NOTE — Progress Notes (Signed)
HISTORY OF PRESENT ILLNESS:  Andrew Walls is a 54 y.o. male with multiple medical problems as listed below. 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 He was last seen in the office 07/28/2013. At that time, he was 2 months out from acute coronary syndrome with coronary artery stent placement and Plavix therapy. He has been maintained on Lialda 4.8 g daily. He was in remission at the time of his last evaluation. He presents today for follow-up with a chief complaint of lower abdominal pain of 3-4 months duration. He describes it as a burning discomfort that can occur 2-3 times weekly or last all day. The discomfort lasts at least 30 minutes. Seems to be exacerbated by some meals. No change with activity. He reports normal formed bowel movements once to 3 times daily. No blood or mucus. He continues on Lialda. He is accompanied by Andrew Walls. He has occasional nausea but no vomiting. No weight loss. He is due to see his cardiologist for routine follow-up some time in the next few months. Blood work from December 2015 reveals normal hemoglobin of 13.2. He reports moderate control of his diabetes. He did have a CT scan of the abdomen and pelvis March 2014 to evaluate left lower quadrant abdominal pain. There were no acute findings. He has tried Tylenol for the discomfort.  REVIEW OF SYSTEMS:  All non-GI ROS negative except for back pain, depression, headaches, itching, skin rash, sleeping problems  Past Medical History  Diagnosis Date  . Diabetes mellitus 08/03/2011  . Cancer of sigmoid colon, s/p colectomy [id number], pT3pN0 (0/8 LN) 08/03/2011  . Morbid obesity with BMI of 40.0-44.9, adult 08/03/2011  . Diverticulitis of colon 2006,2011,2012,2013    ?really perforated colon cancer  . Steatohepatitis   .  Anxiety   . Depression   . Blood transfusion without reported diagnosis     age 61  . Cataract   . COPD (chronic obstructive pulmonary disease)   . Hypertension   . Seizures   . Recurrent boils   . Ulcerative colitis   . Coronary atherosclerosis     unspecified type of vessel, native or graft    Past Surgical History  Procedure Laterality Date  . Abdominal surgery    . Colon surgery  04/23/2011    left colectomy  . Colonoscopy    . Polypectomy    . Fracture left leg    . Fracture left arm    . Tonsillectomy    . Cardiac catheterization      with stent placemnet    Social History Andrew Walls  reports that he has never smoked. He has never used smokeless tobacco. He reports that he does not drink alcohol or use illicit drugs.  family history includes Cancer in his father; Heart disease in his maternal grandmother and mother. There is no history of Colon cancer, Rectal cancer, or Stomach cancer.  Allergies  Allergen Reactions  . Sulfamethoxazole Rash       PHYSICAL EXAMINATION: Vital signs: BP 106/58 mmHg  Pulse 84  Ht 6\' 2"  (1.88 m)  Wt 300 lb (136.079 kg)  BMI 38.50 kg/m2 General: Obese, Well-developed, well-nourished, no acute distress HEENT: Sclerae are anicteric, conjunctiva pink. Oral mucosa intact Lungs: Clear Heart: Regular Abdomen: soft, obese, nontender, nondistended, no obvious ascites, no peritoneal signs, normal bowel  sounds. No organomegaly. Extremities: No edema Psychiatric: Depressed-appearing, alert and oriented x3. Cooperative   ASSESSMENT:  #1. 3-4 month history of vague lower abdominal burning discomfort. Etiology unclear based on history. May be secondary to adhesions. Rule out local recurrence of neoplasia. #2. History of colon cancer status post sigmoid resection January 2013 (elsewhere) #3. Universal ulcerative colitis diagnosed April 2014. In clinical remission on Lialda #4. Multiple medical problems including coronary artery  disease status post coronary artery stent placement, on Plavix   PLAN:  #1. CBC comprehensive metabolic panel today #2. Contrast-enhanced CT scan of the abdomen and pelvis to evaluate pain #3. Refill Lialda and continue at 4.8 g daily #4. The patient will need surveillance colonoscopy with biopsies this year. He will consult with his cardiologist regarding the feasibility of holding Plavix for 5-7 days to undergo the procedure. I suspect this will be acceptable after the one year anniversary of his stent placement. #5. After the above workup complete symptomatic therapy for discomfort if no specific abnormalities identified   The plan was discussed with patient and his relative

## 2014-04-06 NOTE — Patient Instructions (Addendum)
Your physician has requested that you go to the basement for the following lab work before leaving today:  CBC, CMET  We have sent the following medications to your pharmacy for you to pick up at your convenience:  [facility] have been scheduled for a CT scan of the abdomen and pelvis at [facility] ([address]---this is in the same building as [facility]).   You are scheduled on 04/11/2014 at 10:30am. You should arrive 15 minutes prior to your appointment time for registration. Please follow the written instructions below on the day of your exam:  WARNING: IF YOU ARE ALLERGIC TO IODINE/X-RAY DYE, PLEASE NOTIFY RADIOLOGY IMMEDIATELY AT [phone]! YOU WILL BE GIVEN A 13 HOUR PREMEDICATION PREP.  1) Do not eat or drink anything after  (4 hours prior to your test) 2) You have been given 2 bottles of oral contrast to drink. The solution may taste better if refrigerated, but do NOT add ice or any other liquid to this solution. Shake well before drinking.    Drink 1 bottle of contrast @ 8:30am (2 hours prior to your exam)  Drink 1 bottle of contrast @ 9:30am (1 hour prior to your exam)  You may take any medications as prescribed with a small amount of water except for the following: Metformin, Glucophage, Glucovance, Avandamet, Riomet, Fortamet, Actoplus Met, Janumet, Glumetza or Metaglip. The above medications must be held the day of the exam AND 48 hours after the exam.  The purpose of you drinking the oral contrast is to aid in the visualization of your intestinal tract. The contrast solution may cause some diarrhea. Before your exam is started, you will be given a small amount of fluid to drink. Depending on your individual set of symptoms, you may also receive an intravenous injection of x-ray contrast/dye. Plan on being at [facility] for 30 minutes or long, depending on the type of exam you are having performed.  If you have any questions regarding your exam  or if you need to reschedule, you may call the CT department at [phone] between the hours of 8:00 am and 5:00 pm, Monday-Friday.  ________________________________________________________________________

## 2014-04-11 ENCOUNTER — Encounter: Payer: Medicare Other | Attending: "Endocrinology | Admitting: Nutrition

## 2014-04-11 ENCOUNTER — Encounter: Payer: Self-pay | Admitting: Nutrition

## 2014-04-11 ENCOUNTER — Ambulatory Visit (INDEPENDENT_AMBULATORY_CARE_PROVIDER_SITE_OTHER)
Admission: RE | Admit: 2014-04-11 | Discharge: 2014-04-11 | Disposition: A | Discharge status: Home / Self Care | Payer: Medicare Other | Source: Ambulatory Visit | Attending: Internal Medicine | Admitting: Internal Medicine

## 2014-04-11 VITALS — Ht 74.0 in | Wt 298.0 lb

## 2014-04-11 DIAGNOSIS — Z713 Dietary counseling and surveillance: Secondary | ICD-10-CM | POA: Insufficient documentation

## 2014-04-11 DIAGNOSIS — E118 Type 2 diabetes mellitus with unspecified complications: Secondary | ICD-10-CM | POA: Diagnosis not present

## 2014-04-11 DIAGNOSIS — E1165 Type 2 diabetes mellitus with hyperglycemia: Secondary | ICD-10-CM

## 2014-04-11 DIAGNOSIS — R1084 Generalized abdominal pain: Secondary | ICD-10-CM

## 2014-04-11 DIAGNOSIS — Z6837 Body mass index (BMI) 37.0-37.9, adult: Secondary | ICD-10-CM | POA: Diagnosis not present

## 2014-04-11 DIAGNOSIS — IMO0002 Reserved for concepts with insufficient information to code with codable children: Secondary | ICD-10-CM

## 2014-04-11 DIAGNOSIS — K5190 Ulcerative colitis, unspecified, without complications: Secondary | ICD-10-CM

## 2014-04-11 MED ORDER — IOHEXOL 300 MG/ML  SOLN
100.0000 mL | Freq: Once | INTRAMUSCULAR | Status: AC | PRN
  Administered 2014-04-11: 100 mL via INTRAVENOUS

## 2014-04-11 NOTE — Progress Notes (Signed)
  Medical Nutrition Therapy:  Appt start time: 1330 end time:  1430.  Assessment:  Primary concerns today: Diabetes. He lives with his brother. Most recent A1C was >9 and it's up from 7.4%.  He had a CT scan of his stomach today and didn't eat breakfast or lunch yet. BS was 141 mg/dl taken by LPN. He reports not eating some day due to feeling nausea and sick on his stomach at times. Checks his blood sugar 4 times per day. Needs lancets and needles for his pens. Not able to get much exercise.  Taking Lantus 40 units and Humalog 5+ units with meals and 1.8 mg/dl of Vicotza. Does eat on regular basis and inconsistent with meal pattern.    His stomach problems may be contributed by inconsistent eating pattern, low blood sugar and possible gastroparesis.  Preferred Learning Style:   Auditory  Hands on  Learning Readiness:     Contemplating  Ready  Change in progress   MEDICATIONS:  See list   DIETARY INTAKE:.    24-hr recall:  B ( AM): skips sometimes: may have 1 -2 eggs and toast, water  Snk ( AM):   L ( PM): Hamburger, ff and diet soda Snk ( PM):  D ( PM): Beef, mashed potaotes and green beans Snk ( PM):   Beverages: water or diet soda  Usual physical activity: ADL's  Estimated energy needs: 1800 calories 200 g carbohydrates 135 g protein 50 g fat  Progress Towards Goal(s):  In progress.   Nutritional Diagnosis:  NB-1.1 Food and nutrition-related knowledge deficit As related to Diabetes.  As evidenced by A1C > 8%    Intervention:  Nutrition counseling and diabetes educaiton provided on CHO Counting, My Plate, portion sizes, target ranges for blood sugars, complications of DM and benefits of a lower fat, higher fiber diet for needed weight loss.  Goals:  Follow Diabetes Meal Plan as instructed  Eat 3 meals   Avoid skipping meals.  Take insulin before meals and at bedtime as prescribed.  Increase water intake and low carb vegetables.  Limit carbohydrate  intake to 45-60 grams carbohydrate/meal  Add lean protein foods to meals  Monitor glucose levels as instructed by your doctor  Aim for 15 mins of physical activity daily  Bring food record and glucose log to your next nutrition visit  Goal: Get A1C down to 7.5% in three months 2. Lose 1 lb per week.  Teaching Method Utilized:  Visual Auditory Hands on  Handouts given during visit include: The Plate Method Carb Counting and Food Label handouts Meal Plan Card  Barriers to learning/adherence to lifestyle change: none  Demonstrated degree of understanding via:  Teach Back   Monitoring/Evaluation:  Dietary intake, exercise, meal planning, SBG, and body weight in 1 month(s).

## 2014-04-20 ENCOUNTER — Telehealth: Payer: Self-pay | Admitting: Internal Medicine

## 2014-04-20 ENCOUNTER — Other Ambulatory Visit: Payer: Self-pay

## 2014-04-20 DIAGNOSIS — R160 Hepatomegaly, not elsewhere classified: Secondary | ICD-10-CM

## 2014-04-20 NOTE — Telephone Encounter (Signed)
Pt called and wanted a referral to see Dr. Benay Spice. Pt was seen at the cancer center in [place] in the past but does not want to go back to them. Referral made to the cancer center for Dr. Benay Spice.

## 2014-04-24 ENCOUNTER — Encounter: Payer: Self-pay | Admitting: *Deleted

## 2014-04-24 NOTE — Telephone Encounter (Signed)
Pt scheduled to see Dr. Benay Spice 04/30/14.

## 2014-04-24 NOTE — [facility] Oncology Navigator Note (Signed)
Received referral from Dr. Henrene Pastor for liver mass. H/O stage II colon cancer in 2013 and was followed in [place]. Patient wishes to be followed in [place]. Will have HIM obtain colonoscopy reports from [facility] from 04/12/14 and 04/21/11 with pathology. Will also obtain records from [facility]: labs, scans, notes,treatment records. Called home and left VM to return call to GI Nurse Navigator for an appointment. Plan to see him on 04/30/14 at 2pm if patient agrees. Will be added to 05/02/14 GI Conference.

## 2014-04-25 ENCOUNTER — Encounter: Payer: Self-pay | Admitting: *Deleted

## 2014-04-25 NOTE — [facility] Oncology Navigator Note (Signed)
Informed Andrew Walls that Dr. Benay Spice is able to see him on 05/02/14 at 11:00. Inquired if transportation will be a chronic issue/problem for him? If he needs treatment, he will need to be able to get to [facility] couple times week. States his brother is only off work on Wednesday and with planning could get away. Inquired if he would like to pursue seeing Dr. Whitney Muse at [facility], which would be closer. He agrees to see Dr. Whitney Muse. Referral forwarded to [facility].

## 2014-04-30 ENCOUNTER — Ambulatory Visit: Payer: Medicare Other

## 2014-04-30 ENCOUNTER — Ambulatory Visit: Payer: Medicare Other | Admitting: Oncology

## 2014-05-09 ENCOUNTER — Telehealth: Payer: Self-pay | Admitting: Family Medicine

## 2014-05-09 NOTE — Telephone Encounter (Signed)
Noted, agree with above. Thanks

## 2014-05-09 NOTE — Telephone Encounter (Signed)
Patient called & left a msg on the office voicemail that he thought he was having or has had a stroke & he wanted to be seen. I was given the message when it was taking off the voicemail & I immediately called the patient. Patient states that he started having symptoms on Monday of slurred speech, mouth drooping, left arm heaviness. He states he went to the ER on Monday & that they didn't do much but some bloodwork. He states that he had a CT scan a week prior to having these sxs, but he wasn't re-scanned on Monday. He did say that his sxs have gotten worse today. I advised patient that he needs to go back to the ER right away so his increase in sxs can be assessed & he should have another scan. Patient verbalized good understanding & says he will go back to the ER. I did tell him once he was re-evaluated & treated he could follow-up with our office.

## 2014-05-14 ENCOUNTER — Telehealth: Payer: Self-pay | Admitting: *Deleted

## 2014-05-14 NOTE — Telephone Encounter (Signed)
Patient called and left message requesting return call.

## 2014-05-14 NOTE — Telephone Encounter (Signed)
Called Mr. Crill who reports he was simply returning a call he received late Friday that was Urgent.  Phone number for Forestine Na given with instructions to call Forestine Na as no one from this office called patient.

## 2014-05-16 ENCOUNTER — Encounter: Payer: Medicare Other | Admitting: Nutrition

## 2014-05-21 ENCOUNTER — Ambulatory Visit (HOSPITAL_COMMUNITY): Payer: Medicare Other | Admitting: Hematology & Oncology

## 2014-05-23 ENCOUNTER — Encounter (HOSPITAL_COMMUNITY): Payer: Self-pay | Admitting: Hematology & Oncology

## 2014-05-23 ENCOUNTER — Encounter (HOSPITAL_COMMUNITY): Payer: Medicare Other | Attending: Hematology & Oncology | Admitting: Hematology & Oncology

## 2014-05-23 VITALS — BP 127/71 | HR 89 | Temp 97.4°F | Resp 20 | Ht 75.0 in | Wt 297.0 lb

## 2014-05-23 DIAGNOSIS — C187 Malignant neoplasm of sigmoid colon: Secondary | ICD-10-CM | POA: Diagnosis present

## 2014-05-23 DIAGNOSIS — K5190 Ulcerative colitis, unspecified, without complications: Secondary | ICD-10-CM | POA: Diagnosis not present

## 2014-05-23 DIAGNOSIS — Z66 Do not resuscitate: Secondary | ICD-10-CM

## 2014-05-23 DIAGNOSIS — C787 Secondary malignant neoplasm of liver and intrahepatic bile duct: Secondary | ICD-10-CM | POA: Diagnosis not present

## 2014-05-23 DIAGNOSIS — C772 Secondary and unspecified malignant neoplasm of intra-abdominal lymph nodes: Secondary | ICD-10-CM

## 2014-05-23 LAB — COMPREHENSIVE METABOLIC PANEL
ALBUMIN: 3.9 g/dL (ref 3.5–5.2)
ALT: 22 U/L (ref 0–53)
ANION GAP: 10 (ref 5–15)
AST: 23 U/L (ref 0–37)
Alkaline Phosphatase: 92 U/L (ref 39–117)
BUN: 15 mg/dL (ref 6–23)
CALCIUM: 9.1 mg/dL (ref 8.4–10.5)
CHLORIDE: 105 mmol/L (ref 96–112)
CO2: 23 mmol/L (ref 19–32)
CREATININE: 1.35 mg/dL (ref 0.50–1.35)
GFR calc Af Amer: 67 mL/min — ABNORMAL LOW (ref >90)
GFR calc non Af Amer: 58 mL/min — ABNORMAL LOW (ref >90)
GLUCOSE: 201 mg/dL — AB (ref 70–99)
Potassium: 4.3 mmol/L (ref 3.5–5.1)
Sodium: 138 mmol/L (ref 135–145)
TOTAL PROTEIN: 7.4 g/dL (ref 6.0–8.3)
Total Bilirubin: 0.6 mg/dL (ref 0.3–1.2)

## 2014-05-23 NOTE — Progress Notes (Signed)
Andrew Walls presented for labwork. Labs per MD order drawn via Peripheral Line 23 gauge needle inserted in let antecubital.  Good blood return present. Procedure without incident.  Needle removed intact. Patient tolerated procedure well.

## 2014-05-23 NOTE — Progress Notes (Signed)
[place] CONSULT NOTE  Patient Care Team: Glenda Chroman, MD as PCP - General (Internal Medicine) Milus Height, MD as Consulting Physician (Hematology and Oncology) Michael Boston, MD as Attending Physician (General Surgery) Irene Shipper, MD as Consulting Physician (Gastroenterology)  CHIEF COMPLAINTS/PURPOSE OF CONSULTATION:  CRC with history of sigmoid resection 03/2011 Ulcerative colitis diagnosed April 2014 CT A/P on 04/11/2014 with new liver metastases, new LAD in the portacaval space and retroperitoneum MRI of the head with and without contrast at [facility] on 05/03/2014 with no acute intracranial abnormality, chronic left parietal infarct CAD, fixed defect involving the inferior wall as well as the apex at the septum Multiple syncopal events  HISTORY OF PRESENTING ILLNESS:  Andrew Walls 54 y.o. male is here because of stage IV colon cancer. He has a history of ulcerative colitis. He was diagnosed with an early colon cancer in 2013 that was treated with a sigmoid resection. He is a DO NOT RESUSCITATE. He states that he has lived with pain for many years related to his colitis, and he is a DNR because he is tired of pain. He has a known history of depression.  He had a colonoscopy with biopsy on 04/21/2011, with cancer of the sigmoid colon seen and verified by frozen section. The final staging of his CRC is currently not available. The patient states he did not receive any chemotherapy after surgery. CT imaging from [facility] hospital in 2013 shows no evidence of metastatic disease.  He saw Dr. Henrene Pastor in January of this year and complained of lower abdominal discomfort. Given his cancer history a CT of the abdomen and pelvis was performed to evaluate his pain. CT of the abdomen and pelvis on 04/11/2014 showed new liver metastases, with the largest lesion measuring 7.1 x 8.1 cm. New lymphadenopathy in the portacaval space and retroperitoneum was also noted. The  largest lymph node measured 2.7 cm. He is here today for further recommendations in regards of how to proceed.    MEDICAL HISTORY:  Past Medical History  Diagnosis Date  . Diabetes mellitus 08/03/2011  . Cancer of sigmoid colon, s/p colectomy [id number], pT3pN0 (0/8 LN) 08/03/2011  . Morbid obesity with BMI of 40.0-44.9, adult 08/03/2011  . Diverticulitis of colon 2006,2011,2012,2013    ?really perforated colon cancer  . Steatohepatitis   . Anxiety   . Depression   . Blood transfusion without reported diagnosis     age 20  . Cataract   . COPD (chronic obstructive pulmonary disease)   . Hypertension   . Seizures   . Recurrent boils   . Ulcerative colitis   . Coronary atherosclerosis     unspecified type of vessel, native or graft    SURGICAL HISTORY: Past Surgical History  Procedure Laterality Date  . Abdominal surgery    . Colon surgery  04/23/2011    left colectomy  . Colonoscopy    . Polypectomy    . Fracture left leg    . Fracture left arm    . Tonsillectomy    . Cardiac catheterization      with stent placemnet    SOCIAL HISTORY: History   Social History  . Marital Status: Single    Spouse Name: N/A  . Number of Children: N/A  . Years of Education: N/A   Occupational History  . Not on file.   Social History Main Topics  . Smoking status: Never Smoker   . Smokeless tobacco: Never Used  .  Alcohol Use: No  . Drug Use: No  . Sexual Activity: Not on file   Other Topics Concern  . Not on file   Social History Narrative   he was born in [place]. He is a never smoker. He does not drink alcohol. 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 He is single and has no children. His major source of support is a cousin.  FAMILY HISTORY: Family History  Problem Relation Age of Onset  . Heart disease Mother   . Cancer Father     melanoma  . Heart disease Maternal Grandmother   . Colon cancer Neg Hx   . Rectal cancer Neg Hx   .  Stomach cancer Neg Hx    indicated that his mother is deceased. He indicated that his father is deceased.   His mother died at 23 from complications of CHF. His father died when the patient was 62 and in MVA. He is no colon cancer in his family he had a maternal uncle with colon cancer but he notes it was found early. He has one stepbrother, 2 sisters, and another brother. He says they are all healthy. He does not have a lot of contact with one of his sisters.  ALLERGIES:  is allergic to neosporin and sulfamethoxazole.  MEDICATIONS:  Current Outpatient Prescriptions  Medication Sig Dispense Refill  . albuterol (VENTOLIN HFA) 108 (90 BASE) MCG/ACT inhaler Inhale 2 puffs into the lungs every 6 (six) hours as needed.    Marland Kitchen aspirin EC 81 MG tablet Take 81 mg by mouth daily.    Marland Kitchen atorvastatin (LIPITOR) 10 MG tablet Take 10 mg by mouth at bedtime.     . clopidogrel (PLAVIX) 75 MG tablet Take 75 mg by mouth daily with breakfast.    . fexofenadine (ALLEGRA) 180 MG tablet Take 180 mg by mouth 2 (two) times daily.    Marland Kitchen FLUoxetine (PROZAC) 20 MG capsule Take 20 mg by mouth daily.    Marland Kitchen gabapentin (NEURONTIN) 400 MG capsule Take 400 mg by mouth at bedtime.    . halobetasol (ULTRAVATE) 0.05 % cream Apply 1 application topically daily as needed (for irritation).    . Insulin Lispro, Human, (HUMALOG KWIKPEN [place]) Inject 5-12 Units into the skin 3 (three) times daily before meals. Sliding Scale 90-150 = 5 units  151-200= 6 units 201-250= 7 units 251-300= 8 units 301-350= 9 units 351-400= 10 units As directed per sliding scale instructions    . INVOKANA 100 MG TABS Take 100 mg by mouth daily.     . irbesartan (AVAPRO) 75 MG tablet Take 75 mg by mouth daily.    . isosorbide dinitrate (ISORDIL) 30 MG tablet Take 30 mg by mouth daily.    . LamoTRIgine 100 MG TB24 Take 1 tablet (100 mg total) by mouth daily. 30 tablet 5  . LANTUS SOLOSTAR 100 UNIT/ML SOPN Inject 40 Units into the skin at bedtime.     Marland Kitchen  levothyroxine (SYNTHROID, LEVOTHROID) 25 MCG tablet Take 25 mcg by mouth daily before breakfast.    . Liraglutide (VICTOZA) 18 MG/3ML SOPN Inject 1.8 Units into the skin daily.     Marland Kitchen LITETOUCH PEN NEEDLES 31G X 8 MM MISC 4 (four) times daily.  3  . mesalamine (LIALDA) 1.2 G EC tablet TAKE TWO (2) TABLETS BY MOUTH TWICE DAILY 120 tablet 6  . metoCLOPramide (REGLAN) 5 MG tablet Take 5 mg by mouth daily.   0  .  metoprolol succinate (TOPROL-XL) 25 MG 24 hr tablet Take 25 mg by mouth daily.  3  . nitroGLYCERIN (NITROSTAT) 0.4 MG SL tablet Place 0.4 mg under the tongue every 5 (five) minutes as needed for chest pain.    Marland Kitchen PRODIGY TWIST TOP LANCETS 28G MISC daily.   12  . triamcinolone cream (KENALOG) 0.1 % Apply 1 application topically 2 (two) times daily.     No current facility-administered medications for this visit.    Review of Systems  Constitutional: Positive for malaise/fatigue.  HENT: Negative.   Eyes: Negative.   Respiratory: Positive for shortness of breath.   Cardiovascular: Negative.   Gastrointestinal: Positive for abdominal pain.  Musculoskeletal: Positive for back pain.  Skin: Negative.   Neurological: Negative.   Endo/Heme/Allergies: Negative.   Psychiatric/Behavioral: Positive for depression. The patient is nervous/anxious.     PHYSICAL EXAMINATION: ECOG PERFORMANCE STATUS: 1 - Symptomatic but completely ambulatory  There were no vitals filed for this visit. There were no vitals filed for this visit.   Physical Exam  Constitutional: He is oriented to person, place, and time and well-developed, well-nourished, and in no distress.  Obese  HENT:  Head: Normocephalic and atraumatic.  Nose: Nose normal.  Mouth/Throat: Oropharynx is clear and moist. No oropharyngeal exudate.  Eyes: Conjunctivae and EOM are normal. Pupils are equal, round, and reactive to light. Right eye exhibits no discharge. Left eye exhibits no discharge. No scleral icterus.  Neck: Normal range of  motion. Neck supple. No tracheal deviation present. No thyromegaly present.  Cardiovascular: Normal rate, regular rhythm and normal heart sounds.  Exam reveals no gallop and no friction rub.   No murmur heard. Pulmonary/Chest: Effort normal and breath sounds normal. He has no wheezes. He has no rales.  Abdominal: Soft. Bowel sounds are normal. He exhibits no distension and no mass. There is no tenderness. There is no rebound and no guarding.  Musculoskeletal: Normal range of motion. He exhibits no edema.  Lymphadenopathy:    He has no cervical adenopathy.  Neurological: He is alert and oriented to person, place, and time. He has normal reflexes. No cranial nerve deficit. Gait normal. Coordination normal.  Skin: Skin is warm and dry. No rash noted.  Psychiatric: Mood, memory, affect and judgment normal.  Nursing note and vitals reviewed.    LABORATORY DATA:  I have reviewed the data as listed Lab Results  Component Value Date   WBC 10.2 04/06/2014   HGB 13.6 04/06/2014   HCT 42.4 04/06/2014   MCV 83.0 04/06/2014   PLT 340.0 04/06/2014     Chemistry      Component Value Date/Time   NA 136 04/06/2014 0933   K 4.5 04/06/2014 0933   CL 103 04/06/2014 0933   CO2 24 04/06/2014 0933   BUN 21 04/06/2014 0933   CREATININE 1.38 04/06/2014 0933   CREATININE 1.06 11/27/2011 1244      Component Value Date/Time   CALCIUM 9.6 04/06/2014 0933   ALKPHOS 106 04/06/2014 0933   AST 25 04/06/2014 0933   ALT 28 04/06/2014 0933   BILITOT 0.7 04/06/2014 0933     Pathology: Diagnosis 1. Surgical [P], cecal, right colon bx - CHRONIC ACTIVE COLITIS, NO DYSPLASIA. 2. Surgical [P], descending, transverse colon, bx - CHRONIC ACTIVE COLITIS, NO DYSPLASIA. 3. Surgical [P], recto-sigmoid, bx - CHRONIC ACTIVE COLITIS, NO DYSPLASIA. Microscopic Comment 1. - 3. All the biopsy fragments are severely inflamed with crypt architecture distortion, expansion of the lamina propria by sheets of plasma  cells, lymphoid aggregates and neutrophils. Cryptitis and surface erosion are also present. There is Paneth cell metaplasia present in the rectosigmoid colon biopsy. The overall findings are diagnostic for chronic active ulcerative colitis. There is no evidence of dysplasia, adenomatous changes or malignancy identified. Clinical and endoscopic correlation is highly recommended. (HCL:eps 06/27/12) Aldona Bar MD Pathologist, Electronic Signature (Case signed 06/27/2012)   RADIOGRAPHIC STUDIES: I have personally reviewed the radiological images as listed and agreed with the findings in the report.  ASSESSMENT & PLAN:   Stage IV colon cancer with liver metastases and abdominal lymph node involvement History of ulcerative colitis History of colorectal cancer of the sigmoid colon in 2013 DO NOT RESUSCITATE History of depression  54 year old male who presents with abnormal CT imaging of the abdomen, history of colorectal cancer and ulcerative colitis, with findings certainly consistent with stage IV colon cancer. The patient initially stated he was not interested in pursuing any therapy. We spent some time discussing his DO NOT RESUSCITATE status, his history of depression, and his reasons for not being interested in pursuing treatment.   I advised the patient that it certainly was his decision to make. I would support any decision he chose. We discussed the role of palliative care/hospice were he to not choose therapy.    We did discuss treatment options for colon cancer and tolerability of treatment options. After our discussion he wished to proceed with additional recommendations. We set him up for a biopsy to confirm this stage IV disease. I provided him with reading information from the NCCN. We discussed average life expectancy of patients with stage IV colon cancer. We will plan on seeing him back after his biopsy to review the results and readdress any questions he may have.  I did touch  base with his cardiologist in regards to holding his plavix, given his MI and coronary intervention last year.  All questions were answered. The patient knows to call the clinic with any problems, questions or concerns.  This note was electronically signed.    Molli Hazard, MD MD 05/23/2014 1:46 PM

## 2014-05-23 NOTE — Patient Instructions (Signed)
[place] at [facility] Discharge Instructions  RECOMMENDATIONS MADE BY THE CONSULTANT AND ANY TEST RESULTS WILL BE SENT TO YOUR REFERRING PHYSICIAN.  Lab work today. We will get you scheduled for a CT guided biopsy. Interventional Radiology will call you to schedule the appointment. We will plan to see you 1 week after the biopsy to go over the results and set a plan of treatment for you.  Thank you for choosing [place] at [facility] to provide your oncology and hematology care.  To afford each patient quality time with our provider, please arrive at least 15 minutes before your scheduled appointment time.    You need to re-schedule your appointment should you arrive 10 or more minutes late.  We strive to give you quality time with our providers, and arriving late affects you and other patients whose appointments are after yours.  Also, if you no show three or more times for appointments you may be dismissed from the clinic at the providers discretion.     Again, thank you for choosing [facility].  Our hope is that these requests will decrease the amount of time that you wait before being seen by our physicians.       _____________________________________________________________  Should you have questions after your visit to [facility], please contact our office at [phone] between the hours of 8:30 a.m. and 4:30 p.m.  Voicemails left after 4:30 p.m. will not be returned until the following business day.  For prescription refill requests, have your pharmacy contact our office.

## 2014-05-24 ENCOUNTER — Telehealth: Payer: Self-pay | Admitting: Neurology

## 2014-05-24 ENCOUNTER — Other Ambulatory Visit (HOSPITAL_COMMUNITY): Payer: Self-pay | Admitting: Hematology & Oncology

## 2014-05-24 DIAGNOSIS — C187 Malignant neoplasm of sigmoid colon: Secondary | ICD-10-CM

## 2014-05-24 LAB — CEA: CEA: 102.8 ng/mL — AB (ref 0.0–4.7)

## 2014-05-24 NOTE — Telephone Encounter (Signed)
Pt canceled his f/u appt for 06/20/14 due to him moving at the end of the month. Pt will call later to r/s.

## 2014-06-11 ENCOUNTER — Ambulatory Visit (HOSPITAL_COMMUNITY): Payer: Medicare Other | Admitting: Hematology & Oncology

## 2014-06-11 ENCOUNTER — Other Ambulatory Visit: Payer: Self-pay | Admitting: Radiology

## 2014-06-12 ENCOUNTER — Other Ambulatory Visit: Payer: Self-pay | Admitting: Radiology

## 2014-06-13 ENCOUNTER — Encounter (HOSPITAL_COMMUNITY): Payer: Self-pay

## 2014-06-13 ENCOUNTER — Ambulatory Visit (HOSPITAL_COMMUNITY)
Admission: RE | Admit: 2014-06-13 | Discharge: 2014-06-13 | Disposition: A | Discharge status: Home / Self Care | Payer: Medicare Other | Source: Ambulatory Visit | Attending: Diagnostic Radiology | Admitting: Diagnostic Radiology

## 2014-06-13 DIAGNOSIS — Z9049 Acquired absence of other specified parts of digestive tract: Secondary | ICD-10-CM | POA: Insufficient documentation

## 2014-06-13 DIAGNOSIS — K5792 Diverticulitis of intestine, part unspecified, without perforation or abscess without bleeding: Secondary | ICD-10-CM | POA: Diagnosis not present

## 2014-06-13 DIAGNOSIS — Z79899 Other long term (current) drug therapy: Secondary | ICD-10-CM | POA: Insufficient documentation

## 2014-06-13 DIAGNOSIS — C787 Secondary malignant neoplasm of liver and intrahepatic bile duct: Secondary | ICD-10-CM | POA: Insufficient documentation

## 2014-06-13 DIAGNOSIS — J449 Chronic obstructive pulmonary disease, unspecified: Secondary | ICD-10-CM | POA: Insufficient documentation

## 2014-06-13 DIAGNOSIS — C187 Malignant neoplasm of sigmoid colon: Secondary | ICD-10-CM | POA: Diagnosis present

## 2014-06-13 DIAGNOSIS — F419 Anxiety disorder, unspecified: Secondary | ICD-10-CM | POA: Diagnosis not present

## 2014-06-13 DIAGNOSIS — I2510 Atherosclerotic heart disease of native coronary artery without angina pectoris: Secondary | ICD-10-CM | POA: Insufficient documentation

## 2014-06-13 DIAGNOSIS — E119 Type 2 diabetes mellitus without complications: Secondary | ICD-10-CM | POA: Insufficient documentation

## 2014-06-13 DIAGNOSIS — Z955 Presence of coronary angioplasty implant and graft: Secondary | ICD-10-CM | POA: Diagnosis not present

## 2014-06-13 DIAGNOSIS — F329 Major depressive disorder, single episode, unspecified: Secondary | ICD-10-CM | POA: Diagnosis not present

## 2014-06-13 DIAGNOSIS — Z794 Long term (current) use of insulin: Secondary | ICD-10-CM | POA: Diagnosis not present

## 2014-06-13 DIAGNOSIS — Z7982 Long term (current) use of aspirin: Secondary | ICD-10-CM | POA: Insufficient documentation

## 2014-06-13 DIAGNOSIS — I10 Essential (primary) hypertension: Secondary | ICD-10-CM | POA: Insufficient documentation

## 2014-06-13 DIAGNOSIS — K7581 Nonalcoholic steatohepatitis (NASH): Secondary | ICD-10-CM | POA: Diagnosis not present

## 2014-06-13 DIAGNOSIS — K5190 Ulcerative colitis, unspecified, without complications: Secondary | ICD-10-CM | POA: Diagnosis not present

## 2014-06-13 LAB — CBC
HCT: 37.9 % — ABNORMAL LOW (ref 39.0–52.0)
HEMOGLOBIN: 12 g/dL — AB (ref 13.0–17.0)
MCH: 26.3 pg (ref 26.0–34.0)
MCHC: 31.7 g/dL (ref 30.0–36.0)
MCV: 82.9 fL (ref 78.0–100.0)
Platelets: 316 10*3/uL (ref 150–400)
RBC: 4.57 MIL/uL (ref 4.22–5.81)
RDW: 15.3 % (ref 11.5–15.5)
WBC: 9.4 10*3/uL (ref 4.0–10.5)

## 2014-06-13 LAB — PROTIME-INR
INR: 1.08 (ref 0.00–1.49)
Prothrombin Time: 14.1 seconds (ref 11.6–15.2)

## 2014-06-13 LAB — GLUCOSE, CAPILLARY
GLUCOSE-CAPILLARY: 105 mg/dL — AB (ref 70–99)
GLUCOSE-CAPILLARY: 81 mg/dL (ref 70–99)

## 2014-06-13 LAB — APTT: APTT: 32 s (ref 24–37)

## 2014-06-13 MED ORDER — MIDAZOLAM HCL 2 MG/2ML IJ SOLN
INTRAMUSCULAR | Status: AC
  Filled 2014-06-13: qty 4

## 2014-06-13 MED ORDER — MIDAZOLAM HCL 2 MG/2ML IJ SOLN
INTRAMUSCULAR | Status: AC | PRN
  Administered 2014-06-13: 1 mg via INTRAVENOUS

## 2014-06-13 MED ORDER — FENTANYL CITRATE 0.05 MG/ML IJ SOLN
INTRAMUSCULAR | Status: AC
  Filled 2014-06-13: qty 4

## 2014-06-13 MED ORDER — SODIUM CHLORIDE 0.9 % IV SOLN: INTRAVENOUS | Status: DC

## 2014-06-13 MED ORDER — HYDROCODONE-ACETAMINOPHEN 5-325 MG PO TABS: 1.0000 | ORAL_TABLET | ORAL | Status: DC | PRN

## 2014-06-13 MED ORDER — FENTANYL CITRATE 0.05 MG/ML IJ SOLN
INTRAMUSCULAR | Status: AC | PRN
  Administered 2014-06-13 (×2): 50 ug via INTRAVENOUS

## 2014-06-13 NOTE — H&P (Signed)
Chief Complaint: "I'm here for a biopsy" Referring Physician:[place] HPI: Andrew Walls is an 54 y.o. male with hx of colon cancer. He has had recent CT which finds new hepatic lesions concerning for metastasis. He is scheduled for US guided biopsy today Pt feels ok, has been NPO all day PMHx, meds reviewed. Pt states has not taken Plavix in the past 6-7 days  Past Medical History:  Past Medical History  Diagnosis Date  . Diabetes mellitus 08/03/2011  . Morbid obesity with BMI of 40.0-44.9, adult 08/03/2011  . Diverticulitis of colon 2006,2011,2012,2013    ?really perforated colon cancer  . Steatohepatitis   . Anxiety   . Depression   . Blood transfusion without reported diagnosis     age 52  . COPD (chronic obstructive pulmonary disease)   . Hypertension   . Recurrent boils   . Ulcerative colitis   . Coronary atherosclerosis     unspecified type of vessel, native or graft  . Cancer of sigmoid colon, s/p colectomy [id number], pT3pN0 (0/8 LN) 03/2011  . Cataract   . Seizures     Past Surgical History:  Past Surgical History  Procedure Laterality Date  . Abdominal surgery    . Colon surgery  04/23/2011    left colectomy  . Colonoscopy    . Polypectomy    . Fracture left leg    . Fracture left arm    . Tonsillectomy    . Cardiac catheterization      with stent placemnet    Family History:  Family History  Problem Relation Age of Onset  . Heart disease Mother   . Cancer Father     melanoma  . Heart disease Maternal Grandmother   . Colon cancer Neg Hx   . Rectal cancer Neg Hx   . Stomach cancer Neg Hx     Social History:  reports that he has never smoked. He has never used smokeless tobacco. He reports that he does not drink alcohol or use illicit drugs.  Allergies:  Allergies  Allergen Reactions  . Neosporin [Neomycin-Bacitracin Zn-Polymyx]   . Sulfamethoxazole Rash    Medications:   Medication List    ASK your doctor about these medications        acetaminophen 500 MG tablet  Commonly known as:  TYLENOL  Take 500 mg by mouth every 4 (four) hours as needed for mild pain.     aspirin EC 81 MG tablet  Take 81 mg by mouth daily.     atorvastatin 10 MG tablet  Commonly known as:  LIPITOR  Take 10 mg by mouth at bedtime.     baclofen 10 MG tablet  Commonly known as:  LIORESAL  Take 10 mg by mouth 3 (three) times daily.     clopidogrel 75 MG tablet  Commonly known as:  PLAVIX  Take 75 mg by mouth daily with breakfast.     diphenhydrAMINE 25 MG tablet  Commonly known as:  BENADRYL  Take 50 mg by mouth at bedtime as needed for itching, allergies or sleep.     FLUoxetine 20 MG capsule  Commonly known as:  PROZAC  Take 20 mg by mouth daily.     gabapentin 400 MG capsule  Commonly known as:  NEURONTIN  Take 400 mg by mouth at bedtime.     halobetasol 0.05 % cream  Commonly known as:  ULTRAVATE  Apply 1 application topically daily as needed (for irritation).     HUMALOG [facility]  [place]  - Inject 5-12 Units into the skin 3 (three) times daily before meals. Sliding Scale  - 90-150 = 5 units   - 151-200= 6 units  - 201-250= 7 units  - 251-300= 8 units  - 301-350= 9 units  - 351-400= 10 units  - As directed per sliding scale instructions     INVOKANA 100 MG Tabs tablet  Generic drug:  canagliflozin  Take 100 mg by mouth daily.     irbesartan 75 MG tablet  Commonly known as:  AVAPRO  Take 75 mg by mouth daily.     isosorbide mononitrate 30 MG 24 hr tablet  Commonly known as:  IMDUR  Take 30 mg by mouth daily.     LamoTRIgine 100 MG Tb24  Take 1 tablet (100 mg total) by mouth daily.     LANTUS SOLOSTAR 100 UNIT/ML Solostar Pen  Generic drug:  Insulin Glargine  Inject 40 Units into the skin at bedtime.     levothyroxine 25 MCG tablet  Commonly known as:  SYNTHROID, LEVOTHROID  Take 25 mcg by mouth daily before breakfast.     LITETOUCH PEN NEEDLES 31G X 8 MM Misc  Generic drug:  Insulin Pen Needle  4 (four)  times daily.     mesalamine 1.2 G EC tablet  Commonly known as:  LIALDA  TAKE TWO (2) TABLETS BY MOUTH TWICE DAILY     metoprolol succinate 25 MG 24 hr tablet  Commonly known as:  TOPROL-XL  Take 25 mg by mouth daily.     morphine 30 MG 12 hr tablet  Commonly known as:  MS CONTIN  Take 30 mg by mouth 2 (two) times daily.     nitroGLYCERIN 0.4 MG SL tablet  Commonly known as:  NITROSTAT  Place 0.4 mg under the tongue every 5 (five) minutes as needed for chest pain.     oxyCODONE 15 MG immediate release tablet  Commonly known as:  ROXICODONE  Take 15 mg by mouth 3 (three) times daily as needed for pain.     PRODIGY TWIST TOP LANCETS 28G Misc  daily.     promethazine 25 MG tablet  Commonly known as:  PHENERGAN  Take 25 mg by mouth every 6 (six) hours as needed for nausea or vomiting.     triamcinolone cream 0.1 %  Commonly known as:  KENALOG  Apply 1 application topically 2 (two) times daily.     VENTOLIN HFA 108 (90 BASE) MCG/ACT inhaler  Generic drug:  albuterol  Inhale 2 puffs into the lungs every 6 (six) hours as needed for wheezing or shortness of breath.     VICTOZA 18 MG/3ML Sopn  Generic drug:  Liraglutide  Inject 1.8 Units into the skin daily.        Please HPI for pertinent positives, otherwise complete 10 system ROS negative.  Physical Exam: BP 100/65 mmHg  Pulse 72  Temp(Src) 98.2 F (36.8 C)  Resp 18  Ht 6\' 1"  (1.854 m)  Wt 300 lb (136.079 kg)  BMI 39.59 kg/m2  SpO2 100% Body mass index is 39.59 kg/(m^2).   General Appearance:  Alert, cooperative, no distress, appears stated age  Head:  Normocephalic, without obvious abnormality, atraumatic  ENT: Unremarkable  Neck: Supple, symmetrical, trachea midline  Lungs:   Clear to auscultation bilaterally, no w/r/r, respirations unlabored without use of accessory muscles.  Chest Wall:  No tenderness or deformity  Heart:  Regular rate and rhythm, S1, S2 normal, no murmur, rub  or gallop.  Abdomen:    Soft, non-tender, non distended.  Neurologic: Normal affect, no gross deficits.  Labs: Results for orders placed or performed during the hospital encounter of 06/13/14 (from the past 48 hour(s))  Glucose, capillary     Status: Abnormal   Collection Time: 06/13/14 12:09 PM  Result Value Ref Range   Glucose-Capillary 105 (H) 70 - 99 mg/dL   Comment 1 Notify RN   APTT upon arrival     Status: None   Collection Time: 06/13/14 12:29 PM  Result Value Ref Range   aPTT 32 24 - 37 seconds  CBC upon arrival     Status: Abnormal   Collection Time: 06/13/14 12:29 PM  Result Value Ref Range   WBC 9.4 4.0 - 10.5 K/uL   RBC 4.57 4.22 - 5.81 MIL/uL   Hemoglobin 12.0 (L) 13.0 - 17.0 g/dL   HCT 37.9 (L) 39.0 - 52.0 %   MCV 82.9 78.0 - 100.0 fL   MCH 26.3 26.0 - 34.0 pg   MCHC 31.7 30.0 - 36.0 g/dL   RDW 15.3 11.5 - 15.5 %   Platelets 316 150 - 400 K/uL  Protime-INR upon arrival     Status: None   Collection Time: 06/13/14 12:29 PM  Result Value Ref Range   Prothrombin Time 14.1 11.6 - 15.2 seconds   INR 1.08 0.00 - 1.49    Imaging: No results found.  Assessment/Plan Hx of colon cancer Multiple hepatic lesions concerning for mets Labs ok. Risks and Benefits discussed with the patient including, but not limited to bleeding, infection, damage to adjacent structures or low yield requiring additional tests. All of the patient's questions were answered, patient is agreeable to proceed. Consent signed and in chart.   Ascencion Dike PA-C 06/13/2014, 1:54 PM

## 2014-06-13 NOTE — Procedures (Signed)
US guided liver lesion biopsy.  3 cores obtained from right hepatic lesion.  Minimal blood loss.  No immediate complication.

## 2014-06-13 NOTE — Discharge Instructions (Signed)
Liver Biopsy, Care After °Refer to this sheet in the next few weeks. These instructions provide you with information on caring for yourself after your procedure. Your health care provider may also give you more specific instructions. Your treatment has been planned according to current medical practices, but problems sometimes occur. Call your health care provider if you have any problems or questions after your procedure. °WHAT TO EXPECT AFTER THE PROCEDURE °After your procedure, it is typical to have the following: °· A small amount of discomfort in the area where the biopsy was done and in the right shoulder or shoulder blade. °· A small amount of bruising around the area where the biopsy was done and on the skin over the liver. °· Sleepiness and fatigue for the rest of the day. °HOME CARE INSTRUCTIONS  °· Rest at home for 1-2 days or as directed by your health care provider. °· Have a friend or family member stay with you for at least 24 hours. °· Because of the medicines used during the procedure, you should not do the following things in the first 24 hours: °¨ Drive. °¨ Use machinery. °¨ Be responsible for the care of other people. °¨ Sign legal documents. °¨ Take a bath or shower. °· There are many different ways to close and cover an incision, including stitches, skin glue, and adhesive strips. Follow your health care provider's instructions on: °¨ Incision care. °¨ Bandage (dressing) changes and removal. °¨ Incision closure removal. °· Do not drink alcohol in the first week. °· Do not lift more than 5 pounds or play contact sports for 2 weeks after this test. °· Take medicines only as directed by your health care provider. Do not take medicine containing aspirin or non-steroidal anti-inflammatory medicines such as ibuprofen for 1 week after this test. °· It is your responsibility to get your test results. °SEEK MEDICAL CARE IF:  °· You have increased bleeding from an incision that results in more than a  small spot of blood. °· You have redness, swelling, or increasing pain in any incisions. °· You notice a discharge or a bad smell coming from any of your incisions. °· You have a fever or chills. °SEEK IMMEDIATE MEDICAL CARE IF:  °· You develop swelling, bloating, or pain in your abdomen. °· You become dizzy or faint. °· You develop a rash. °· You are nauseous or vomit. °· You have difficulty breathing, feel short of breath, or feel faint. °· You develop chest pain. °· You have problems with your speech or vision. °· You have trouble balancing or moving your arms or legs. °Document Released: 09/26/2004 Document Revised: 07/24/2013 Document Reviewed: 05/05/2013 °ExitCare® Patient Information ©2015 [employer]. This information is not intended to replace advice given to you by your health care provider. Make sure you discuss any questions you have with your health care provider. ° °

## 2014-06-13 NOTE — Sedation Documentation (Signed)
Bandaid R upper abdomen area, dry, intact

## 2014-06-13 NOTE — Progress Notes (Signed)
BP 83/45 pt denies dizziness, no pain, abdomen is soft bilateral. Pt was given oral fluids, will monitor closely.

## 2014-06-14 ENCOUNTER — Encounter (HOSPITAL_COMMUNITY): Payer: Self-pay | Admitting: Hematology & Oncology

## 2014-06-19 ENCOUNTER — Other Ambulatory Visit: Payer: Self-pay | Admitting: Family Medicine

## 2014-06-19 MED ORDER — LAMOTRIGINE ER 100 MG PO TB24: 100.0000 mg | ORAL_TABLET | Freq: Every day | ORAL | Status: DC

## 2014-06-20 ENCOUNTER — Encounter (HOSPITAL_BASED_OUTPATIENT_CLINIC_OR_DEPARTMENT_OTHER): Payer: Medicare Other | Admitting: Hematology & Oncology

## 2014-06-20 ENCOUNTER — Encounter (HOSPITAL_COMMUNITY): Payer: Self-pay | Admitting: Hematology & Oncology

## 2014-06-20 ENCOUNTER — Ambulatory Visit: Payer: Medicare Other | Admitting: Neurology

## 2014-06-20 VITALS — BP 109/72 | HR 94 | Temp 97.7°F | Resp 18 | Wt 283.6 lb

## 2014-06-20 DIAGNOSIS — C772 Secondary and unspecified malignant neoplasm of intra-abdominal lymph nodes: Secondary | ICD-10-CM

## 2014-06-20 DIAGNOSIS — C189 Malignant neoplasm of colon, unspecified: Secondary | ICD-10-CM

## 2014-06-20 DIAGNOSIS — C187 Malignant neoplasm of sigmoid colon: Secondary | ICD-10-CM

## 2014-06-20 DIAGNOSIS — C787 Secondary malignant neoplasm of liver and intrahepatic bile duct: Secondary | ICD-10-CM | POA: Diagnosis not present

## 2014-06-20 DIAGNOSIS — K5190 Ulcerative colitis, unspecified, without complications: Secondary | ICD-10-CM | POA: Diagnosis not present

## 2014-06-20 DIAGNOSIS — F329 Major depressive disorder, single episode, unspecified: Secondary | ICD-10-CM | POA: Diagnosis not present

## 2014-06-20 LAB — COMPREHENSIVE METABOLIC PANEL
ALT: 32 U/L (ref 0–53)
ANION GAP: 13 (ref 5–15)
AST: 31 U/L (ref 0–37)
Albumin: 4 g/dL (ref 3.5–5.2)
Alkaline Phosphatase: 118 U/L — ABNORMAL HIGH (ref 39–117)
BILIRUBIN TOTAL: 0.9 mg/dL (ref 0.3–1.2)
BUN: 23 mg/dL (ref 6–23)
CO2: 24 mmol/L (ref 19–32)
CREATININE: 1.41 mg/dL — AB (ref 0.50–1.35)
Calcium: 9.6 mg/dL (ref 8.4–10.5)
Chloride: 103 mmol/L (ref 96–112)
GFR calc non Af Amer: 55 mL/min — ABNORMAL LOW (ref >90)
GFR, EST AFRICAN AMERICAN: 63 mL/min — AB (ref >90)
GLUCOSE: 177 mg/dL — AB (ref 70–99)
Potassium: 4.4 mmol/L (ref 3.5–5.1)
Sodium: 140 mmol/L (ref 135–145)
Total Protein: 8 g/dL (ref 6.0–8.3)

## 2014-06-20 LAB — CBC
HEMATOCRIT: 40 % (ref 39.0–52.0)
Hemoglobin: 12.6 g/dL — ABNORMAL LOW (ref 13.0–17.0)
MCH: 26.7 pg (ref 26.0–34.0)
MCHC: 31.5 g/dL (ref 30.0–36.0)
MCV: 84.7 fL (ref 78.0–100.0)
PLATELETS: 337 10*3/uL (ref 150–400)
RBC: 4.72 MIL/uL (ref 4.22–5.81)
RDW: 15 % (ref 11.5–15.5)
WBC: 10.2 10*3/uL (ref 4.0–10.5)

## 2014-06-20 NOTE — Progress Notes (Signed)
[name] CONSULT NOTE  Patient Care Team: Glenda Chroman, MD as PCP - General (Internal Medicine) Milus Height, MD as Consulting Physician (Hematology and Oncology) Michael Boston, MD as Attending Physician (General Surgery) Irene Shipper, MD as Consulting Physician (Gastroenterology)  CHIEF COMPLAINTS/PURPOSE OF CONSULTATION:  CRC with history of sigmoid resection 03/2011 Ulcerative colitis diagnosed April 2014 CT A/P on 04/11/2014 with new liver metastases, new LAD in the portacaval space and retroperitoneum MRI of the head with and without contrast at [facility] on 05/03/2014 with no acute intracranial abnormality, chronic left parietal infarct CAD, fixed defect involving the inferior wall as well as the apex at the septum Multiple syncopal events  HISTORY OF PRESENTING ILLNESS:  Andrew Walls 54 y.o. male is here because of stage IV colon cancer. He has a history of ulcerative colitis. He was diagnosed with an early colon cancer in 2013 that was treated with a sigmoid resection. He is a DO NOT RESUSCITATE. He states that he has lived with pain for many years related to his colitis, and he is a DNR because he is tired of pain. He has a known history of depression.  He had a colonoscopy with biopsy on 04/21/2011, with cancer of the sigmoid colon seen and verified by frozen section. The final staging of his CRC is currently not available. The patient states he did not receive any chemotherapy after surgery. CT imaging from [facility] hospital in 2013 shows no evidence of metastatic disease.  He saw Dr. Henrene Pastor in January of this year and complained of lower abdominal discomfort. Given his cancer history a CT of the abdomen and pelvis was performed to evaluate his pain. CT of the abdomen and pelvis on 04/11/2014 showed new liver metastases, with the largest lesion measuring 7.1 x 8.1 cm. New lymphadenopathy in the portacaval space and retroperitoneum was also noted. The  largest lymph node measured 2.7 cm. He is here today for further recommendations in regards of how to proceed.    MEDICAL HISTORY:  Past Medical History  Diagnosis Date  . Diabetes mellitus 08/03/2011  . Morbid obesity with BMI of 40.0-44.9, adult 08/03/2011  . Diverticulitis of colon 2006,2011,2012,2013    ?really perforated colon cancer  . Steatohepatitis   . Anxiety   . Depression   . Blood transfusion without reported diagnosis     age 46  . COPD (chronic obstructive pulmonary disease)   . Hypertension   . Recurrent boils   . Ulcerative colitis   . Coronary atherosclerosis     unspecified type of vessel, native or graft  . Cancer of sigmoid colon, s/p colectomy [id number], pT3pN0 (0/8 LN) 03/2011  . Cataract   . Seizures     SURGICAL HISTORY: Past Surgical History  Procedure Laterality Date  . Abdominal surgery    . Colon surgery  04/23/2011    left colectomy  . Colonoscopy    . Polypectomy    . Fracture left leg    . Fracture left arm    . Tonsillectomy    . Cardiac catheterization      with stent placemnet    SOCIAL HISTORY: History   Social History  . Marital Status: Single    Spouse Name: N/A  . Number of Children: N/A  . Years of Education: N/A   Occupational History  . Not on file.   Social History Main Topics  . Smoking status: Never Smoker   . Smokeless tobacco: Never Used  .  Alcohol Use: No  . Drug Use: No  . Sexual Activity: Not on file   Other Topics Concern  . Not on file   Social History Narrative   he was born in [place]. He is a never smoker. He does not drink alcohol. 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 He is single and has no children. His major source of support is a cousin.  FAMILY HISTORY: Family History  Problem Relation Age of Onset  . Heart disease Mother   . Cancer Father     melanoma  . Heart disease Maternal Grandmother   . Colon cancer Neg Hx   . Rectal cancer Neg Hx   .  Stomach cancer Neg Hx    indicated that his mother is deceased. He indicated that his father is deceased.   His mother died at 38 from complications of CHF. His father died when the patient was 7 and in MVA. He is no colon cancer in his family he had a maternal uncle with colon cancer but he notes it was found early. He has one stepbrother, 2 sisters, and another brother. He says they are all healthy. He does not have a lot of contact with one of his sisters.  ALLERGIES:  is allergic to neosporin and sulfamethoxazole.  MEDICATIONS:  Current Outpatient Prescriptions  Medication Sig Dispense Refill  . acetaminophen (TYLENOL) 500 MG tablet Take 500 mg by mouth every 4 (four) hours as needed for mild pain.     Marland Kitchen albuterol (VENTOLIN HFA) 108 (90 BASE) MCG/ACT inhaler Inhale 2 puffs into the lungs every 6 (six) hours as needed for wheezing or shortness of breath.     Marland Kitchen aspirin EC 81 MG tablet Take 81 mg by mouth daily.    Marland Kitchen atorvastatin (LIPITOR) 10 MG tablet Take 10 mg by mouth at bedtime.     . baclofen (LIORESAL) 10 MG tablet Take 10 mg by mouth 3 (three) times daily.    . clopidogrel (PLAVIX) 75 MG tablet Take 75 mg by mouth daily with breakfast.    . diphenhydrAMINE (BENADRYL) 25 MG tablet Take 50 mg by mouth at bedtime as needed for itching, allergies or sleep.     Marland Kitchen FLUoxetine (PROZAC) 20 MG capsule Take 20 mg by mouth daily.    Marland Kitchen gabapentin (NEURONTIN) 400 MG capsule Take 400 mg by mouth at bedtime.    . halobetasol (ULTRAVATE) 0.05 % cream Apply 1 application topically daily as needed (for irritation).    . Insulin Lispro, Human, (HUMALOG KWIKPEN [place]) Inject 5-12 Units into the skin 3 (three) times daily before meals. Sliding Scale 90-150 = 5 units  151-200= 6 units 201-250= 7 units 251-300= 8 units 301-350= 9 units 351-400= 10 units As directed per sliding scale instructions    . INVOKANA 100 MG TABS Take 100 mg by mouth daily.     . irbesartan (AVAPRO) 75 MG tablet Take 75 mg by  mouth daily.    . isosorbide mononitrate (IMDUR) 30 MG 24 hr tablet Take 30 mg by mouth daily.  4  . LamoTRIgine 100 MG TB24 Take 1 tablet (100 mg total) by mouth daily. 30 tablet 1  . LANTUS SOLOSTAR 100 UNIT/ML SOPN Inject 40 Units into the skin at bedtime.     Marland Kitchen levothyroxine (SYNTHROID, LEVOTHROID) 25 MCG tablet Take 25 mcg by mouth daily before breakfast.    . Liraglutide (VICTOZA) 18 MG/3ML SOPN Inject 1.8 Units into the skin  daily.     Marland Kitchen LITETOUCH PEN NEEDLES 31G X 8 MM MISC 4 (four) times daily.  3  . mesalamine (LIALDA) 1.2 G EC tablet TAKE TWO (2) TABLETS BY MOUTH TWICE DAILY 120 tablet 6  . metoprolol succinate (TOPROL-XL) 25 MG 24 hr tablet Take 25 mg by mouth daily.  3  . morphine (MS CONTIN) 30 MG 12 hr tablet Take 30 mg by mouth 2 (two) times daily.  0  . nitroGLYCERIN (NITROSTAT) 0.4 MG SL tablet Place 0.4 mg under the tongue every 5 (five) minutes as needed for chest pain.    Marland Kitchen oxyCODONE (ROXICODONE) 15 MG immediate release tablet Take 15 mg by mouth 3 (three) times daily as needed for pain.   0  . PRODIGY TWIST TOP LANCETS 28G MISC daily.   12  . promethazine (PHENERGAN) 25 MG tablet Take 25 mg by mouth every 6 (six) hours as needed for nausea or vomiting.    . triamcinolone cream (KENALOG) 0.1 % Apply 1 application topically 2 (two) times daily.     No current facility-administered medications for this visit.    ROS  PHYSICAL EXAMINATION: ECOG PERFORMANCE STATUS: 1 - Symptomatic but completely ambulatory  There were no vitals filed for this visit. There were no vitals filed for this visit.   Physical Exam  Constitutional: He is oriented to person, place, and time and well-developed, well-nourished, and in no distress.  Obese  HENT:  Head: Normocephalic and atraumatic.  Nose: Nose normal.  Mouth/Throat: Oropharynx is clear and moist. No oropharyngeal exudate.  Eyes: Conjunctivae and EOM are normal. Pupils are equal, round, and reactive to light. Right eye exhibits  no discharge. Left eye exhibits no discharge. No scleral icterus.  Neck: Normal range of motion. Neck supple. No tracheal deviation present. No thyromegaly present.  Cardiovascular: Normal rate, regular rhythm and normal heart sounds.  Exam reveals no gallop and no friction rub.   No murmur heard. Pulmonary/Chest: Effort normal and breath sounds normal. He has no wheezes. He has no rales.  Abdominal: Soft. Bowel sounds are normal. He exhibits no distension and no mass. There is no tenderness. There is no rebound and no guarding.  Musculoskeletal: Normal range of motion. He exhibits no edema.  Lymphadenopathy:    He has no cervical adenopathy.  Neurological: He is alert and oriented to person, place, and time. He has normal reflexes. No cranial nerve deficit. Gait normal. Coordination normal.  Skin: Skin is warm and dry. No rash noted.  Psychiatric: Mood, memory, affect and judgment normal.  Nursing note and vitals reviewed.    LABORATORY DATA:  I have reviewed the data as listed Lab Results  Component Value Date   WBC 9.4 06/13/2014   HGB 12.0* 06/13/2014   HCT 37.9* 06/13/2014   MCV 82.9 06/13/2014   PLT 316 06/13/2014     Chemistry      Component Value Date/Time   NA 138 05/23/2014 1523   K 4.3 05/23/2014 1523   CL 105 05/23/2014 1523   CO2 23 05/23/2014 1523   BUN 15 05/23/2014 1523   CREATININE 1.35 05/23/2014 1523   CREATININE 1.06 11/27/2011 1244      Component Value Date/Time   CALCIUM 9.1 05/23/2014 1523   ALKPHOS 92 05/23/2014 1523   AST 23 05/23/2014 1523   ALT 22 05/23/2014 1523   BILITOT 0.6 05/23/2014 1523     Pathology: Diagnosis 1. Surgical [P], cecal, right colon bx - CHRONIC ACTIVE COLITIS, NO DYSPLASIA. 2.  Surgical [P], descending, transverse colon, bx - CHRONIC ACTIVE COLITIS, NO DYSPLASIA. 3. Surgical [P], recto-sigmoid, bx - CHRONIC ACTIVE COLITIS, NO DYSPLASIA. Microscopic Comment 1. - 3. All the biopsy fragments are severely inflamed with  crypt architecture distortion, expansion of the lamina propria by sheets of plasma cells, lymphoid aggregates and neutrophils. Cryptitis and surface erosion are also present. There is Paneth cell metaplasia present in the rectosigmoid colon biopsy. The overall findings are diagnostic for chronic active ulcerative colitis. There is no evidence of dysplasia, adenomatous changes or malignancy identified. Clinical and endoscopic correlation is highly recommended. (HCL:eps 06/27/12) Aldona Bar MD Pathologist, Electronic Signature (Case signed 06/27/2012)  FINAL DIAGNOSIS Diagnosis Liver, needle/core biopsy, right lobe - METASTATIC ADENOCARCINOMA, SEE COMMENT. Microscopic Comment The history of primary colorectal carcinoma is noted. In the current case, needle core biopsies demonstrate extensive involvement by metastatic adenocarcinoma, morphologically consistent with primary colorectal. In lieu of further immunophenotyping, tissue will be reserved for the indicated ancillary tumor testing. If ancillary tumor testing is clinically needed, please contact pathology. The case was reviewed with Dr. Lyndon Code who concurs. (CRR:gt, 06/14/14) Mali RUND DO Pathologist, Electronic Signature (Case signed 06/14/2014) Specimen Gross and Clinical Information Specimen(s) Obtained: Liver, needle/core biopsy, right lobe Specimen Clinical Information colon cancer, metastatic disease (cm) Gross Received in formalin are four cores of gray white to red soft tissue ranging from 0.3 x 0.1 cm to 1 x 0.1 cm, submitted in toto in one block. (SSW:kh 06-13-14) Report signed out from the following location(s) Technical Component was performed at [facility]. [address],[place] [address].[id number]., Interpretation was performed at [address], [place], [place] [address]. CLIA #: [id number],     RADIOGRAPHIC STUDIES: I have personally  reviewed the radiological images as listed and agreed with the findings in the report.  ASSESSMENT & PLAN:   Stage IV colon cancer with liver metastases and abdominal lymph node involvement History of ulcerative colitis History of colorectal cancer of the sigmoid colon in 2013 DO NOT RESUSCITATE History of depression  54 year old male who presents with abnormal CT imaging of the abdomen, history of colorectal cancer and ulcerative colitis, with findings certainly consistent with stage IV colon cancer. The patient initially stated he was not interested in pursuing any therapy. We spent some time discussing his DO NOT RESUSCITATE status, his history of depression, and his reasons for not being interested in pursuing treatment.   I advised the patient that it certainly was his decision to make. I would support any decision he chose. We discussed the role of palliative care/hospice were he to not choose therapy.    We did discuss treatment options for colon cancer and tolerability of treatment options. After our discussion he wished to proceed with additional recommendations. We set him up for a biopsy to confirm this stage IV disease. I provided him with reading information from the NCCN. We discussed average life expectancy of patients with stage IV colon cancer. We will plan on seeing him back after his biopsy to review the results and readdress any questions he may have.  I did touch base with his cardiologist in regards to holding his plavix, given his MI and coronary intervention last year.  All questions were answered. The patient knows to call the clinic with any problems, questions or concerns.  This note was electronically signed.    Molli Hazard, MD MD 06/20/2014 8:52 AM

## 2014-06-20 NOTE — Patient Instructions (Signed)
..  [place] at [facility] Discharge Instructions  RECOMMENDATIONS MADE BY THE CONSULTANT AND ANY TEST RESULTS WILL BE SENT TO YOUR REFERRING PHYSICIAN.  Exam per Dr. Whitney Muse  Copy of biopsy pathology given to patient  DNR form completed Return the end of next week      Thank you for choosing [place] at [facility] to provide your oncology and hematology care.  To afford each patient quality time with our provider, please arrive at least 15 minutes before your scheduled appointment time.    You need to re-schedule your appointment should you arrive 10 or more minutes late.  We strive to give you quality time with our providers, and arriving late affects you and other patients whose appointments are after yours.  Also, if you no show three or more times for appointments you may be dismissed from the clinic at the providers discretion.     Again, thank you for choosing [facility].  Our hope is that these requests will decrease the amount of time that you wait before being seen by our physicians.       _____________________________________________________________  Should you have questions after your visit to [facility], please contact our office at [phone] between the hours of 8:30 a.m. and 4:30 p.m.  Voicemails left after 4:30 p.m. will not be returned until the following business day.  For prescription refill requests, have your pharmacy contact our office.

## 2014-06-21 LAB — CEA: CEA: 117.8 ng/mL — ABNORMAL HIGH (ref 0.0–4.7)

## 2014-06-25 ENCOUNTER — Telehealth (HOSPITAL_COMMUNITY): Payer: Self-pay

## 2014-06-25 NOTE — Telephone Encounter (Signed)
Call from Van[place] states "Dr. Whitney Muse was suppose to call us with something different for Andrew Walls's depression.  She was going to check with Mental Health and let us know what else he could take besides the Prozac.  We have not heard anything yet."

## 2014-06-26 ENCOUNTER — Telehealth (HOSPITAL_COMMUNITY): Payer: Self-pay | Admitting: *Deleted

## 2014-06-26 ENCOUNTER — Other Ambulatory Visit (HOSPITAL_COMMUNITY): Payer: Self-pay | Admitting: *Deleted

## 2014-06-26 MED ORDER — DULOXETINE HCL 30 MG PO CPEP: ORAL_CAPSULE | ORAL | Status: DC

## 2014-06-26 NOTE — Telephone Encounter (Signed)
Pt's family member, Stanton Kidney, contacted by me to let her know that we were calling in Cymbalta for [facility]. Pt to stop the Prozac and start Cymbalta @ 30mg  x 7 days in a row. Then increase to 60mg  daily. I let [facility] know to cancel the Prozac Rx. I asked Stanton Kidney to call me back and let me know that she got the message so that we could go over instructions again.

## 2014-06-27 ENCOUNTER — Ambulatory Visit (HOSPITAL_COMMUNITY): Payer: Medicare Other | Admitting: Hematology & Oncology

## 2014-06-28 ENCOUNTER — Ambulatory Visit (HOSPITAL_COMMUNITY): Payer: Medicare Other | Admitting: Hematology & Oncology

## 2014-07-04 ENCOUNTER — Ambulatory Visit (HOSPITAL_COMMUNITY): Payer: Medicare Other | Admitting: Hematology & Oncology

## 2014-07-04 ENCOUNTER — Encounter (HOSPITAL_COMMUNITY): Payer: Medicare Other | Attending: Hematology & Oncology | Admitting: Hematology & Oncology

## 2014-07-04 ENCOUNTER — Encounter (HOSPITAL_COMMUNITY): Payer: Self-pay | Admitting: Hematology & Oncology

## 2014-07-04 VITALS — BP 118/79 | HR 93 | Temp 98.3°F | Resp 18 | Wt 286.0 lb

## 2014-07-04 DIAGNOSIS — F32A Depression, unspecified: Secondary | ICD-10-CM

## 2014-07-04 DIAGNOSIS — F329 Major depressive disorder, single episode, unspecified: Secondary | ICD-10-CM

## 2014-07-04 DIAGNOSIS — C187 Malignant neoplasm of sigmoid colon: Secondary | ICD-10-CM

## 2014-07-04 DIAGNOSIS — Z66 Do not resuscitate: Secondary | ICD-10-CM

## 2014-07-04 DIAGNOSIS — C787 Secondary malignant neoplasm of liver and intrahepatic bile duct: Secondary | ICD-10-CM

## 2014-07-04 DIAGNOSIS — C189 Malignant neoplasm of colon, unspecified: Secondary | ICD-10-CM

## 2014-07-04 DIAGNOSIS — K5190 Ulcerative colitis, unspecified, without complications: Secondary | ICD-10-CM | POA: Insufficient documentation

## 2014-07-04 NOTE — Progress Notes (Signed)
[place] Progress Note  Patient Care Team: Glenda Chroman, MD as PCP - General (Internal Medicine) Milus Height, MD as Consulting Physician (Hematology and Oncology) Michael Boston, MD as Attending Physician (General Surgery) Irene Shipper, MD as Consulting Physician (Gastroenterology)  CHIEF COMPLAINTS/PURPOSE OF CONSULTATION:  CRC with history of sigmoid resection 03/2011 Ulcerative colitis diagnosed April 2014 CT A/P on 04/11/2014 with new liver metastases, new LAD in the portacaval space and retroperitoneum MRI of the head with and without contrast at [facility] on 05/03/2014 with no acute intracranial abnormality, chronic left parietal infarct CAD, fixed defect involving the inferior wall as well as the apex at the septum Multiple syncopal events Liver biopsy 06/13/2014 with metastatic adenocarcinoma  HISTORY OF PRESENTING ILLNESS:  Andrew Walls 54 y.o. male is here because of stage IV colon cancer. We have changed his antidepressant to cymbalta and he has just increased it to 60 mg.  He has been unable to work for several years because of his multiple medical issues and feels that "he has no purpose in life." He is a DNR.  He has been seen by Veronia Beets. Behavioral health in the past.  He comes back today accompanied by his cousin.  He is interested in trying therapy as long "as he does not become a burden to his family." He wants to try XELODA, he is interested in maybe trying AVASTIN.  He does not want a port.   MEDICAL HISTORY:  Past Medical History  Diagnosis Date  . Diabetes mellitus 08/03/2011  . Morbid obesity with BMI of 40.0-44.9, adult 08/03/2011  . Diverticulitis of colon 2006,2011,2012,2013    ?really perforated colon cancer  . Steatohepatitis   . Anxiety   . Depression   . Blood transfusion without reported diagnosis     age 26  . COPD (chronic obstructive pulmonary disease)   . Hypertension   . Recurrent boils   . Ulcerative  colitis   . Coronary atherosclerosis     unspecified type of vessel, native or graft  . Cancer of sigmoid colon, s/p colectomy [id number], pT3pN0 (0/8 LN) 03/2011  . Cataract   . Seizures     SURGICAL HISTORY: Past Surgical History  Procedure Laterality Date  . Abdominal surgery    . Colon surgery  04/23/2011    left colectomy  . Colonoscopy    . Polypectomy    . Fracture left leg    . Fracture left arm    . Tonsillectomy    . Cardiac catheterization      with stent placemnet    SOCIAL HISTORY: History   Social History  . Marital Status: Single    Spouse Name: N/A  . Number of Children: N/A  . Years of Education: N/A   Occupational History  . Not on file.   Social History Main Topics  . Smoking status: Never Smoker   . Smokeless tobacco: Never Used  . Alcohol Use: No  . Drug Use: No  . Sexual Activity: Not on file   Other Topics Concern  . Not on file   Social History Narrative   he was born in [place]. He is a never smoker. He does not drink alcohol. 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 He is single and has no children. His major source of support is a cousin.  FAMILY HISTORY: Family History  Problem Relation Age of Onset  . Heart disease Mother   .  Cancer Father     melanoma  . Heart disease Maternal Grandmother   . Colon cancer Neg Hx   . Rectal cancer Neg Hx   . Stomach cancer Neg Hx    indicated that his mother is deceased. He indicated that his father is deceased.   His mother died at 75 from complications of CHF. His father died when the patient was 54 and in MVA. He is no colon cancer in his family he had a maternal uncle with colon cancer but he notes it was found early. He has one stepbrother, 2 sisters, and another brother. He says they are all healthy. He does not have a lot of contact with one of his sisters.  ALLERGIES:  is allergic to neosporin and sulfamethoxazole.  MEDICATIONS:  Current  Outpatient Prescriptions  Medication Sig Dispense Refill  . acetaminophen (TYLENOL) 500 MG tablet Take 500 mg by mouth every 4 (four) hours as needed for mild pain.     Marland Kitchen albuterol (VENTOLIN HFA) 108 (90 BASE) MCG/ACT inhaler Inhale 2 puffs into the lungs every 6 (six) hours as needed for wheezing or shortness of breath.     Marland Kitchen aspirin EC 81 MG tablet Take 81 mg by mouth daily.    Marland Kitchen atorvastatin (LIPITOR) 10 MG tablet Take 10 mg by mouth at bedtime.     . baclofen (LIORESAL) 10 MG tablet Take 10 mg by mouth 3 (three) times daily.    . diphenhydrAMINE (BENADRYL) 25 MG tablet Take 50 mg by mouth at bedtime as needed for itching, allergies or sleep.     . DULoxetine (CYMBALTA) 30 MG capsule Take 1 capsule (30mg ) by mouth x 7 days in a row. Then increase to 2 tablets (60mg  total) daily. 60 capsule 0  . gabapentin (NEURONTIN) 400 MG capsule Take 400 mg by mouth at bedtime.    . halobetasol (ULTRAVATE) 0.05 % cream Apply 1 application topically daily as needed (for irritation).    . Insulin Lispro, Human, (HUMALOG KWIKPEN [place]) Inject 5-12 Units into the skin 3 (three) times daily before meals. Sliding Scale 90-150 = 5 units  151-200= 6 units 201-250= 7 units 251-300= 8 units 301-350= 9 units 351-400= 10 units As directed per sliding scale instructions    . INVOKANA 100 MG TABS Take 100 mg by mouth daily.     . irbesartan (AVAPRO) 75 MG tablet Take 75 mg by mouth daily.    . LamoTRIgine 100 MG TB24 Take 1 tablet (100 mg total) by mouth daily. 30 tablet 1  . LANTUS SOLOSTAR 100 UNIT/ML SOPN Inject 40 Units into the skin at bedtime.     Marland Kitchen levothyroxine (SYNTHROID, LEVOTHROID) 25 MCG tablet Take 25 mcg by mouth daily before breakfast.    . Liraglutide (VICTOZA) 18 MG/3ML SOPN Inject 1.8 Units into the skin daily.     Marland Kitchen LITETOUCH PEN NEEDLES 31G X 8 MM MISC 4 (four) times daily.  3  . mesalamine (LIALDA) 1.2 G EC tablet TAKE TWO (2) TABLETS BY MOUTH TWICE DAILY 120 tablet 6  . metoCLOPramide (REGLAN) 5  MG tablet     . metoprolol succinate (TOPROL-XL) 25 MG 24 hr tablet Take 25 mg by mouth daily.  3  . morphine (MS CONTIN) 30 MG 12 hr tablet Take 30 mg by mouth 2 (two) times daily.  0  . naloxegol oxalate (MOVANTIK) 25 MG TABS tablet Take 25 mg by mouth daily.    . nitroGLYCERIN (NITROSTAT) 0.4 MG SL tablet Place 0.4  mg under the tongue every 5 (five) minutes as needed for chest pain.    Marland Kitchen oxyCODONE (ROXICODONE) 15 MG immediate release tablet Take 15 mg by mouth 3 (three) times daily as needed for pain.   0  . PRODIGY TWIST TOP LANCETS 28G MISC daily.   12  . promethazine (PHENERGAN) 25 MG tablet Take 25 mg by mouth every 6 (six) hours as needed for nausea or vomiting.    . triamcinolone cream (KENALOG) 0.1 % Apply 1 application topically 2 (two) times daily.     No current facility-administered medications for this visit.    Review of Systems  Constitutional: Positive for malaise/fatigue.  HENT: Negative.   Eyes: Negative.   Respiratory: Negative.   Cardiovascular: Negative.   Gastrointestinal: Positive for abdominal pain.  Genitourinary: Negative.   Musculoskeletal: Positive for back pain and joint pain.  Skin: Negative.   Neurological: Negative.   Endo/Heme/Allergies: Negative.   Psychiatric/Behavioral: Positive for depression. Negative for suicidal ideas and substance abuse. The patient has insomnia.     PHYSICAL EXAMINATION: ECOG PERFORMANCE STATUS: 1 - Symptomatic but completely ambulatory  Filed Vitals:   07/04/14 1417  BP: 118/79  Pulse: 93  Temp: 98.3 F (36.8 C)  Resp: 18   Filed Weights   07/04/14 1417  Weight: 286 lb (129.729 kg)     Physical Exam  Constitutional: He is oriented to person, place, and time and well-developed, well-nourished, and in no distress.  Obese  HENT:  Head: Normocephalic and atraumatic.  Nose: Nose normal.  Mouth/Throat: Oropharynx is clear and moist. No oropharyngeal exudate.  Eyes: Conjunctivae and EOM are normal. Pupils are  equal, round, and reactive to light. Right eye exhibits no discharge. Left eye exhibits no discharge. No scleral icterus.  Neck: Normal range of motion. Neck supple. No tracheal deviation present. No thyromegaly present.  Cardiovascular: Normal rate, regular rhythm and normal heart sounds.  Exam reveals no gallop and no friction rub.   No murmur heard. Pulmonary/Chest: Effort normal and breath sounds normal. He has no wheezes. He has no rales.  Abdominal: Soft. Bowel sounds are normal. He exhibits no distension and no mass. There is no tenderness. There is no rebound and no guarding.  Musculoskeletal: Normal range of motion. He exhibits no edema.  Lymphadenopathy:    He has no cervical adenopathy.  Neurological: He is alert and oriented to person, place, and time. He has normal reflexes. No cranial nerve deficit. Gait normal. Coordination normal.  Skin: Skin is warm and dry. No rash noted.  Psychiatric: Mood, memory, affect and judgment normal.  Nursing note and vitals reviewed.    LABORATORY DATA:  I have reviewed the data as listed Lab Results  Component Value Date   WBC 10.2 06/20/2014   HGB 12.6* 06/20/2014   HCT 40.0 06/20/2014   MCV 84.7 06/20/2014   PLT 337 06/20/2014     Chemistry      Component Value Date/Time   NA 140 06/20/2014 0910   K 4.4 06/20/2014 0910   CL 103 06/20/2014 0910   CO2 24 06/20/2014 0910   BUN 23 06/20/2014 0910   CREATININE 1.41* 06/20/2014 0910   CREATININE 1.06 11/27/2011 1244      Component Value Date/Time   CALCIUM 9.6 06/20/2014 0910   ALKPHOS 118* 06/20/2014 0910   AST 31 06/20/2014 0910   ALT 32 06/20/2014 0910   BILITOT 0.9 06/20/2014 0910     Pathology: Diagnosis 1. Surgical [P], cecal, right colon bx -  CHRONIC ACTIVE COLITIS, NO DYSPLASIA. 2. Surgical [P], descending, transverse colon, bx - CHRONIC ACTIVE COLITIS, NO DYSPLASIA. 3. Surgical [P], recto-sigmoid, bx - CHRONIC ACTIVE COLITIS, NO DYSPLASIA. Microscopic Comment 1.  - 3. All the biopsy fragments are severely inflamed with crypt architecture distortion, expansion of the lamina propria by sheets of plasma cells, lymphoid aggregates and neutrophils. Cryptitis and surface erosion are also present. There is Paneth cell metaplasia present in the rectosigmoid colon biopsy. The overall findings are diagnostic for chronic active ulcerative colitis. There is no evidence of dysplasia, adenomatous changes or malignancy identified. Clinical and endoscopic correlation is highly recommended. (HCL:eps 06/27/12) Aldona Bar MD Pathologist, Electronic Signature (Case signed 06/27/2012)  FINAL DIAGNOSIS Diagnosis Liver, needle/core biopsy, right lobe - METASTATIC ADENOCARCINOMA, SEE COMMENT. Microscopic Comment The history of primary colorectal carcinoma is noted. In the current case, needle core biopsies demonstrate extensive involvement by metastatic adenocarcinoma, morphologically consistent with primary colorectal. In lieu of further immunophenotyping, tissue will be reserved for the indicated ancillary tumor testing. If ancillary tumor testing is clinically needed, please contact pathology. The case was reviewed with Dr. Lyndon Code who concurs. (CRR:gt, 06/14/14) Mali RUND DO Pathologist, Electronic Signature (Case signed 06/14/2014) Specimen Gross and Clinical Information Specimen(s) Obtained: Liver, needle/core biopsy, right lobe Specimen Clinical Information colon cancer, metastatic disease (cm) Gross Received in formalin are four cores of gray white to red soft tissue ranging from 0.3 x 0.1 cm to 1 x 0.1 cm, submitted in toto in one block. (SSW:kh 06-13-14) Report signed out from the following location(s) Technical Component was performed at [facility]. [address],[place] [address].[id number]., Interpretation was performed at [facility] [address], [place], [place] [address]. CLIA #:  [id number],   ASSESSMENT & PLAN:   Stage IV colon cancer with liver metastases and abdominal lymph node involvement History of ulcerative colitis History of colorectal cancer of the sigmoid colon in 2013 DO NOT RESUSCITATE History of depression  54 year old male who presents with abnormal CT imaging of the abdomen, history of colorectal cancer and ulcerative colitis, with findings certainly consistent with stage IV colon cancer. Biopsy of the liver has confirmed Stage IV disease. The patient initially stated he was not interested in pursuing any therapy. We have spent time discussing his DO NOT RESUSCITATE status, his history of depression, and his reasons for not being interested in pursuing treatment. We have changed him to cymbalta and he seems to be tolerating this well.    We again discussed hospice/palliative care in detail.  He has opted to try therapy with XELODA. He may consider adding AVASTIN in the future. He does not desire a port. He states that if he cannot tolerate his treatment he will pursue hospice. He has several cousins that are a good support system for him.   Plan will be for XELODA 7 on/7 off. Once he has the medication we will see him back in the clinic with ongoing monitoring for side effects and response.  All questions were answered. The patient knows to call the clinic with any problems, questions or concerns.  This note was electronically signed.    Molli Hazard, MD 07/04/2014 4:20 PM

## 2014-07-04 NOTE — Patient Instructions (Addendum)
[place] at [facility] Discharge Instructions  RECOMMENDATIONS MADE BY THE CONSULTANT AND ANY TEST RESULTS WILL BE SENT TO YOUR REFERRING PHYSICIAN.  Call the clinic to let us know when you received your Xeloda - we will schedule and office visit for you one week after that.   Thank you for choosing [place] at [facility] to provide your oncology and hematology care.  To afford each patient quality time with our provider, please arrive at least 15 minutes before your scheduled appointment time.    You need to re-schedule your appointment should you arrive 10 or more minutes late.  We strive to give you quality time with our providers, and arriving late affects you and other patients whose appointments are after yours.  Also, if you no show three or more times for appointments you may be dismissed from the clinic at the providers discretion.     Again, thank you for choosing [facility].  Our hope is that these requests will decrease the amount of time that you wait before being seen by our physicians.       _____________________________________________________________  Should you have questions after your visit to [facility], please contact our office at [phone] between the hours of 8:30 a.m. and 4:30 p.m.  Voicemails left after 4:30 p.m. will not be returned until the following business day.  For prescription refill requests, have your pharmacy contact our office.   Capecitabine tablets What is this medicine? CAPECITABINE (ka pe SITE a been) is a chemotherapy drug. It slows the growth of cancer cells. This medicine is used to treat breast cancer, and also colon or rectal cancer. This medicine may be used for other purposes; ask your health care provider or pharmacist if you have questions. COMMON BRAND NAME(S): Xeloda What should I tell my health care provider before I take this medicine? They need to know if  you have any of these conditions: -bleeding or blood disorders -dihydropyrimidine dehydrogenase (DPD) deficiency -heart disease -infection (especially a virus infection such as chickenpox, cold sores, or herpes) -kidney disease -liver disease -an unusual or allergic reaction to capecitabine, 5-fluorouracil, other medicines, foods, dyes, or preservatives -pregnant or trying to get pregnant -breast-feeding How should I use this medicine? Take this medicine by mouth with a glass of water, within 30 minutes of the end of a meal. Do not cut, crush or chew this medicine. Follow the directions on the prescription label. Take your medicine at regular intervals. Do not take it more often than directed. Do not stop taking except on your doctor's advice. Your doctor may want you to take a combination of 150 mg and 500 mg tablets for each dose. It is very important that you know how to correctly take your dose. Taking the wrong tablets could result in an overdose (too much medication) or underdose (too little medication). Talk to your pediatrician regarding the use of this medicine in children. Special care may be needed. Overdosage: If you think you have taken too much of this medicine contact a poison control center or emergency room at once. NOTE: This medicine is only for you. Do not share this medicine with others. What if I miss a dose? If you miss a dose, do not take the missed dose at all. Do not take double or extra doses. Instead, continue with your next scheduled dose and check with your doctor. What may interact with this medicine? -antacids  with aluminum and/or magnesium -folic acid -leucovorin -medicines to increase blood counts like filgrastim, pegfilgrastim, sargramostim -phenytoin -vaccines -warfarin Talk to your doctor or health care professional before taking any of these medicines: -acetaminophen -aspirin -ibuprofen -ketoprofen -naproxen This list may not describe all possible  interactions. Give your health care provider a list of all the medicines, herbs, non-prescription drugs, or dietary supplements you use. Also tell them if you smoke, drink alcohol, or use illegal drugs. Some items may interact with your medicine. What should I watch for while using this medicine? Visit your doctor for checks on your progress. This drug may make you feel generally unwell. This is not uncommon, as chemotherapy can affect healthy cells as well as cancer cells. Report any side effects. Continue your course of treatment even though you feel ill unless your doctor tells you to stop. In some cases, you may be given additional medicines to help with side effects. Follow all directions for their use. Call your doctor or health care professional for advice if you get a fever, chills or sore throat, or other symptoms of a cold or flu. Do not treat yourself. This drug decreases your body's ability to fight infections. Try to avoid being around people who are sick. This medicine may increase your risk to bruise or bleed. Call your doctor or health care professional if you notice any unusual bleeding. Be careful brushing and flossing your teeth or using a toothpick because you may get an infection or bleed more easily. If you have any dental work done, tell your dentist you are receiving this medicine. Avoid taking products that contain aspirin, acetaminophen, ibuprofen, naproxen, or ketoprofen unless instructed by your doctor. These medicines may hide a fever. Do not become pregnant while taking this medicine. Women should inform their doctor if they wish to become pregnant or think they might be pregnant. There is a potential for serious side effects to an unborn child. Talk to your health care professional or pharmacist for more information. Do not breast-feed an infant while taking this medicine. Men are advised not to father a child while taking this medicine. What side effects may I notice from  receiving this medicine? Side effects that you should report to your doctor or health care professional as soon as possible: -allergic reactions like skin rash, itching or hives, swelling of the face, lips, or tongue -low blood counts - this medicine may decrease the number of white blood cells, red blood cells and platelets. You may be at increased risk for infections and bleeding. -signs of infection - fever or chills, cough, sore throat, pain or difficulty passing urine -signs of decreased platelets or bleeding - bruising, pinpoint red spots on the skin, black, tarry stools, blood in the urine -signs of decreased red blood cells - unusually weak or tired, fainting spells, lightheadedness -breathing problems -changes in vision -chest pain -dark urine -diarrhea of more than 4 bowel movements in one day or any diarrhea at night; bloody or watery diarrhea -dizziness -mouth sores -nausea and vomiting -pain, swelling, redness at site where injected -pain, tingling, numbness in the hands or feet -redness, swelling, or sores on hands or feet -stomach pain -vomiting -yellow color of skin or eyes Side effects that usually do not require medical attention (report to your doctor or health care professional if they continue or are bothersome): -constipation -diarrhea -dry or itchy skin -hair loss -loss of appetite -nausea -weak or tired This list may not describe all possible side effects. Call  your doctor for medical advice about side effects. You may report side effects to FDA at 1-800-FDA-1088. Where should I keep my medicine? Keep out of the reach of children. Store at room temperature between 15 and 30 degrees C (59 and 86 degrees F). Keep container tightly closed. Throw away any unused medicine after the expiration date. NOTE: This sheet is a summary. It may not cover all possible information. If you have questions about this medicine, talk to your doctor, pharmacist, or health care  provider.  2015, [employer]vier/[employer] Standard. (2013-01-13 12:47:46)

## 2014-07-05 ENCOUNTER — Other Ambulatory Visit (HOSPITAL_COMMUNITY): Payer: Self-pay | Admitting: Oncology

## 2014-07-11 ENCOUNTER — Other Ambulatory Visit (HOSPITAL_COMMUNITY): Payer: Self-pay | Admitting: Hematology & Oncology

## 2014-07-11 MED ORDER — CAPECITABINE 500 MG PO TABS: 1000.0000 mg/m2 | ORAL_TABLET | Freq: Two times a day (BID) | ORAL | Status: AC

## 2014-07-16 ENCOUNTER — Other Ambulatory Visit (HOSPITAL_COMMUNITY): Payer: Self-pay | Admitting: Oncology

## 2014-07-16 DIAGNOSIS — C187 Malignant neoplasm of sigmoid colon: Secondary | ICD-10-CM

## 2014-07-16 MED ORDER — ONDANSETRON HCL 8 MG PO TABS: 8.0000 mg | ORAL_TABLET | Freq: Three times a day (TID) | ORAL | Status: DC | PRN

## 2014-07-17 ENCOUNTER — Ambulatory Visit (HOSPITAL_COMMUNITY): Payer: Medicare Other | Admitting: Hematology & Oncology

## 2014-07-17 ENCOUNTER — Telehealth (HOSPITAL_COMMUNITY): Payer: Self-pay | Admitting: *Deleted

## 2014-07-17 NOTE — Telephone Encounter (Signed)
I have tried to call patient and patient's cousin to set up a chemo teaching date and no one answers. I left a message on Andrew Walls's phone to call me back so we can set up a chemo teaching date.

## 2014-07-19 NOTE — Progress Notes (Signed)
VYAS,DHRUV B., MD  DIAGNOSIS: CRC with history of sigmoid resection 03/2011 Ulcerative colitis diagnosed April 2014 CT A/P on 04/11/2014 with new liver metastases, new LAD in the portacaval space and retroperitoneum MRI of the head with and without contrast at [facility] on 05/03/2014 with no acute intracranial abnormality, chronic left parietal infarct CAD, fixed defect involving the inferior wall as well as the apex at the septum Multiple syncopal events Ultrasound guided biopsy on 06/13/2014   CURRENT THERAPY: None  INTERVAL HISTORY: Andrew Walls 54 y.o. male returns for follow up. He underwent an ultrasound guided biopsy of the liver. He is here today for review of the results. He did ok with the biopsy.  He continues to struggle with depression. He continues to have chronic back pain. He complains of nausea that has also been chronic.  He is again uncertain if he will try any treatment for his colon cancer. But is open to discussion today.  MEDICAL HISTORY: Past Medical History  Diagnosis Date  . Diabetes mellitus 08/03/2011  . Morbid obesity with BMI of 40.0-44.9, adult 08/03/2011  . Diverticulitis of colon 2006,2011,2012,2013    ?really perforated colon cancer  . Steatohepatitis   . Anxiety   . Depression   . Blood transfusion without reported diagnosis     age 33  . COPD (chronic obstructive pulmonary disease)   . Hypertension   . Recurrent boils   . Ulcerative colitis   . Coronary atherosclerosis     unspecified type of vessel, native or graft  . Cancer of sigmoid colon, s/p colectomy [id number], pT3pN0 (0/8 LN) 03/2011  . Cataract   . Seizures     has Diabetes mellitus; Cancer of sigmoid colon, s/p colectomy Jan2013, cT4pN0 (5.5cm, 0/18 LN), [place] (-); Morbid obesity with BMI of 40.0-44.9, adult; Dizziness - light-headed; Diabetic neuropathy, type II diabetes mellitus; Depression; Postoperative intra-abdominal abscess; Steatohepatitis; Diarrhea; Abdominal  pain, chronic, left lower quadrant; Anxiety; Muscle twitching - Panic attacks vs seizures; Abdominal pain, chronic, right lower quadrant; Back pain; Headache(784.0); Transient alteration of awareness; and Dizziness on his problem list.     is allergic to neosporin and sulfamethoxazole.    SURGICAL HISTORY: Past Surgical History  Procedure Laterality Date  . Abdominal surgery    . Colon surgery  04/23/2011    left colectomy  . Colonoscopy    . Polypectomy    . Fracture left leg    . Fracture left arm    . Tonsillectomy    . Cardiac catheterization      with stent placemnet    SOCIAL HISTORY: History   Social History  . Marital Status: Single    Spouse Name: N/A  . Number of Children: N/A  . Years of Education: N/A   Occupational History  . Not on file.   Social History Main Topics  . Smoking status: Never Smoker   . Smokeless tobacco: Never Used  . Alcohol Use: No  . Drug Use: No  . Sexual Activity: Not on file   Other Topics Concern  . Not on file   Social History Narrative    FAMILY HISTORY: Family History  Problem Relation Age of Onset  . Heart disease Mother   . Cancer Father     melanoma  . Heart disease Maternal Grandmother   . Colon cancer Neg Hx   . Rectal cancer Neg Hx   . Stomach cancer Neg Hx     Review of Systems  Constitutional: Positive  for malaise/fatigue.  HENT: Negative.   Eyes: Negative.   Respiratory: Positive for shortness of breath.   Cardiovascular: Negative.   Gastrointestinal: Positive for nausea.  Genitourinary: Positive for urgency.  Musculoskeletal: Positive for back pain and joint pain.  Skin: Negative.   Neurological: Negative.   Endo/Heme/Allergies: Negative.   Psychiatric/Behavioral: Positive for depression. The patient is nervous/anxious and has insomnia.     PHYSICAL EXAMINATION  ECOG PERFORMANCE STATUS: 1 - Symptomatic but completely ambulatory  Filed Vitals:   06/20/14 0918  BP: 109/72  Pulse: 94  Temp:  97.7 F (36.5 C)  Resp: 18    Physical Exam  Constitutional: He is oriented to person, place, and time and well-developed, well-nourished, and in no distress.  Obese, pleasant, flat affect  HENT:  Head: Normocephalic and atraumatic.  Nose: Nose normal.  Mouth/Throat: Oropharynx is clear and moist. No oropharyngeal exudate.  Eyes: Conjunctivae and EOM are normal. Pupils are equal, round, and reactive to light. Right eye exhibits no discharge. Left eye exhibits no discharge. No scleral icterus.  Neck: Normal range of motion. Neck supple. No tracheal deviation present. No thyromegaly present.  Cardiovascular: Normal rate, regular rhythm and normal heart sounds.  Exam reveals no gallop and no friction rub.   No murmur heard. Pulmonary/Chest: Effort normal and breath sounds normal. He has no wheezes. He has no rales.  Abdominal: Soft. Bowel sounds are normal. He exhibits no distension and no mass. There is no tenderness. There is no rebound and no guarding.  Obese  Musculoskeletal: Normal range of motion. He exhibits no edema.  Lymphadenopathy:    He has no cervical adenopathy.  Neurological: He is alert and oriented to person, place, and time. He has normal reflexes. No cranial nerve deficit. Gait normal. Coordination normal.  Skin: Skin is warm and dry. No rash noted.  Psychiatric: Memory, affect and judgment normal.  Nursing note and vitals reviewed.   LABORATORY DATA:  CBC    Component Value Date/Time   WBC 10.2 06/20/2014 0910   RBC 4.72 06/20/2014 0910   HGB 12.6* 06/20/2014 0910   HCT 40.0 06/20/2014 0910   PLT 337 06/20/2014 0910   MCV 84.7 06/20/2014 0910   MCH 26.7 06/20/2014 0910   MCHC 31.5 06/20/2014 0910   RDW 15.0 06/20/2014 0910   LYMPHSABS 1.4 04/06/2014 0933   MONOABS 0.6 04/06/2014 0933   EOSABS 0.4 04/06/2014 0933   BASOSABS 0.0 04/06/2014 0933   CMP     Component Value Date/Time   NA 140 06/20/2014 0910   K 4.4 06/20/2014 0910   CL 103 06/20/2014  0910   CO2 24 06/20/2014 0910   GLUCOSE 177* 06/20/2014 0910   BUN 23 06/20/2014 0910   CREATININE 1.41* 06/20/2014 0910   CREATININE 1.06 11/27/2011 1244   CALCIUM 9.6 06/20/2014 0910   PROT 8.0 06/20/2014 0910   ALBUMIN 4.0 06/20/2014 0910   AST 31 06/20/2014 0910   ALT 32 06/20/2014 0910   ALKPHOS 118* 06/20/2014 0910   BILITOT 0.9 06/20/2014 0910   GFRNONAA 55* 06/20/2014 0910   GFRAA 63* 06/20/2014 0910     RADIOGRAPHIC STUDIES: CLINICAL DATA: 54 year old with colon cancer and liver lesions. Evaluate for metastatic disease.  EXAM: ULTRASOUND-GUIDED LIVER LESION BIOPSY  Physician: Stephan Minister. Anselm Pancoast, MD  FLUOROSCOPY TIME: None  MEDICATIONS: 1 mg versed, 100 mcg fentanyl. A radiology nurse monitored the patient for moderate sedation.  ANESTHESIA/SEDATION: Moderate sedation time: 20 minutes  PROCEDURE: The procedure was explained to the patient. The  risks and benefits of the procedure were discussed and the patient's questions were addressed. Informed consent was obtained from the patient. The patient's liver was evaluated with ultrasound. The right side of the abdomen was prepped and draped in a sterile fashion. A sterile field was created. The skin and liver capsule were anesthetized with 1% lidocaine. A 17 gauge coaxial needle was directed into the right hepatic lobe with ultrasound guidance. Needle was positioned in the hypoechoic posterior right hepatic mass. A total of 3 core biopsies were obtained with an 18 gauge core device. Specimens were placed in formalin. 17 gauge needle was removed without complication. Bandage placed over the puncture site.  FINDINGS: Large hypoechoic lesion in posterior right hepatic lobe. Needle position confirmed within this lesion. No significant bleeding following the core biopsies.  COMPLICATIONS: None  IMPRESSION: Ultrasound-guided core biopsies of a right hepatic lesion.   Electronically Signed  By: Markus Daft M.D. No results found.   PATHOLOGY: Diagnosis Liver, needle/core biopsy, right lobe - METASTATIC ADENOCARCINOMA, SEE COMMENT.    ASSESSMENT and THERAPY PLAN:   Stage IV colon cancer Ulcerative colitis Liver biopsy with biopsy proven metastatic disease Chronic depression, has been treated by [facility] behavioral health  We again addressed his options for treatment of his colon cancer. He is currently not interested in any IV chemotherapy. He has been disabled for some time because of ulcerative colitis and multiple complications he has had from the disease. We have discussed hospice and he has a good understanding of their services. At this point he thinks that is what he may pursue. He has a fairly good social support system in his cousins. We have come with him 12 his appointments including today.  Reviewed his pathology. I reviewed oral therapy with Xeloda. I reviewed the side effects of Xeloda therapy. I discussed potential benefit of Xeloda therapy.  I addressed his depression. We will try to obtain his records from behavioral health and see what antidepressants he has been tried on. He is open to trying a newer antidepressant. He does not feel his depression is affecting his decision to treat or not to treat his colon cancer. For now we have left it that he will call when he has made his decision on how to proceed forward. If he chooses therapy we will bring him in for an appointment if he chooses hospice we will make the appropriate referral.   All questions were answered. The patient knows to call the clinic with any problems, questions or concerns. We can certainly see the patient much sooner if necessary. This note was electronically signed.  This document serves as a record of services personally performed by Ancil Linsey, MD. It was created on her behalf by Pearlie Oyster, a trained medical scribe. The creation of this record is based on the scribe's personal  observations and the provider's statements to them. This document has been checked and approved by the attending provider.       SPenland MD

## 2014-07-19 NOTE — Patient Instructions (Addendum)
[facility]   CHEMOTHERAPY INSTRUCTIONS  Xeloda - diarrhea, hand-foot syndrome (hands/feet can get red/tender/and skin can peel). Avoid friction and hot environments on hands/feet. Wear cotton socks. Lotion twice a day to hands/fingers/feet/toes with Udder cream. Mucositis (inflammation of any mucosal membrane can develop -this can occur in the throat/mouth). Mouth sores, nausea/vomiting, anemia, fatigue can also develop. Take Imodium if diarrhea develops and contact us immediately - we will give you further instructions on how to take your Imodium. Xeloda usually comes with a teaching packet from the mail order/specialty pharmacy that will supply you with the drug that is really informative about diarrhea and hand-foot syndrome. No pregnant, child bearing age people, or animals should come into contact with this drug. Even though this is in pill form - it is still very powerful!!! If you should be instructed to discontinue taking this drug, bring the drug into the [facility] and we will dispose of it in a safe manner. Chemotherapy is a biohazard and must be disposed of properly. Do not touch this pill much. It would be best if the caregiver wore gloves while handling. Do not put this pill in with the rest of your pills in a pill box. Keep them in a separate pill box. You should take this medication within 30 minutes after eating your am and pm meals.   Your ordered dose of Xeloda: Take 5 tablets (2500mg ) in the morning with food and 5 tablets (2500mg ) in the evening with food. Take for 7 days in a row and then no Xeloda x 7 days. Repeat.   POTENTIAL SIDE EFFECTS OF TREATMENT: Increased Susceptibility to Infection, Vomiting, Constipation, Hair Thinning, Changes in Character of Skin and Nails (brittleness, dryness,etc.), Pigment Changes (darkening of veins, nail beds, palms of hands, soles of feet, etc.), Bone Marrow Suppression, Nausea, Diarrhea, Sun Sensitivity and  Mouth Sores   EDUCATIONAL MATERIALS GIVEN AND REVIEWED: Chemotherapy and You booklet given Specific Instructions Sheets: Xeloda, Zofran   SELF CARE ACTIVITIES WHILE ON CHEMOTHERAPY: Increase your fluid intake 48 hours prior to treatment and drink at least 2 quarts per day after treatment., No alcohol intake., No aspirin or other medications unless approved by your oncologist., Eat foods that are light and easy to digest., Eat foods at cold or room temperature., No fried, fatty, or spicy foods immediately before or after treatment., Have teeth cleaned professionally before starting treatment. Keep dentures and partial plates clean., Use soft toothbrush and do not use mouthwashes that contain alcohol. Biotene is a good mouthwash that is available at most pharmacies or may be ordered by calling [phone]., Use warm salt water gargles (1 teaspoon salt per 1 quart warm water) before and after meals and at bedtime. Or you may rinse with 2 tablespoons of three -percent hydrogen peroxide mixed in eight ounces of water., Always use sunscreen with SPF (Sun Protection Factor) of 30 or higher., Use your nausea medication as directed to prevent nausea., Use your stool softener or laxative as directed to prevent constipation. and Use your anti-diarrheal medication as directed to stop diarrhea.  Please wash your hands for at least 30 seconds using warm soapy water. Handwashing is the #1 way to prevent the spread of germs. Stay away from sick people or people who are getting over a cold. If you develop respiratory systems such as green/yellow mucus production or productive cough or persistent cough let us know and we will see if you need an antibiotic. It is a  good idea to keep a pair of gloves on when going into grocery stores/[employer] to decrease your risk of coming into contact with germs on the carts, etc. Carry alcohol hand gel with you at all times and use it frequently if out in public. All foods need to be  cooked thoroughly. No raw foods. No medium or undercooked meats, eggs. If your food is cooked medium well, it does not need to be hot pink or saturated with bloody liquid at all. Vegetables and fruits need to be washed/rinsed under the faucet with a dish detergent before being consumed. You can eat raw fruits and vegetables unless we tell you otherwise but it would be best if you cooked them or bought frozen. Do not eat off of salad bars or hot bars unless you really trust the cleanliness of the restaurant. If you need dental work, please let Dr. Whitney Muse know before you go for your appointment so that we can coordinate the best possible time for you in regards to your chemo regimen. You need to also let your dentist know that you are actively taking chemo. We may need to do labs prior to your dental appointment. We also want your bowels moving at least every other day. If this is not happening, we need to know so that we can get you on a bowel regimen to help you go.      MEDICATIONS: You have been given prescriptions for the following medications:  Zofran 8mg  tablet. Take 1 tablet every 8 hours as needed for nausea/vomiting.   Xeloda 500mg  tablet. Take 5 tablets (2500mg ) in the morning with food and 5 tablets (2500mg ) in the evening with food. Take for 7 days in a row and then no Xeloda x 7 days. Repeat.  Over-the-Counter Meds:  Miralax 17 grams. Take 1 capful (17 grams) in 8oz of fluid daily. You may increase to two times a day if needed. This is a stool softener.  Senna/Senokot S - Take 1 tablet twice a day and increase to 4 tablets twice a day if needed. This is a stimulant laxative.   Milk of Magnesia - this is a laxative used to treat moderate to severe constipation. May take 2-4 tablespoons every 8 hours as needed. May increase to 8 tablespoons x 1 dose and if no bowel movement call the [facility].  Imodium - this is for diarrhea. Take 2 tabs after 1st loose stool and then 1 tab every 2  hours until you go a total of 12 hours without a loose stool. Call [facility] if loose stools continue.   SYMPTOMS TO REPORT AS SOON AS POSSIBLE AFTER TREATMENT:  FEVER GREATER THAN 100.5 F  CHILLS WITH OR WITHOUT FEVER  NAUSEA AND VOMITING THAT IS NOT CONTROLLED WITH YOUR NAUSEA MEDICATION  UNUSUAL SHORTNESS OF BREATH  UNUSUAL BRUISING OR BLEEDING  TENDERNESS IN MOUTH AND THROAT WITH OR WITHOUT PRESENCE OF ULCERS  URINARY PROBLEMS  BOWEL PROBLEMS  UNUSUAL RASH    Wear comfortable clothing and clothing appropriate for easy access to any Portacath or PICC line. Let us know if there is anything that we can do to make your therapy better!      I have been informed and understand all of the instructions given to me and have received a copy. I have been instructed to call the clinic [phone] or my family physician as soon as possible for continued medical care, if indicated. I do not have any more questions at this  time but understand that I may call the [facility] or the Patient Navigator at [phone] during office hours should I have questions or need assistance in obtaining follow-up care.          Capecitabine tablets What is this medicine? CAPECITABINE (ka pe SITE a been) is a chemotherapy drug. It slows the growth of cancer cells. This medicine is used to treat breast cancer, and also colon or rectal cancer. This medicine may be used for other purposes; ask your health care provider or pharmacist if you have questions. COMMON BRAND NAME(S): Xeloda What should I tell my health care provider before I take this medicine? They need to know if you have any of these conditions: -bleeding or blood disorders -dihydropyrimidine dehydrogenase (DPD) deficiency -heart disease -infection (especially a virus infection such as chickenpox, cold sores, or herpes) -kidney disease -liver disease -an unusual or allergic reaction to capecitabine, 5-fluorouracil,  other medicines, foods, dyes, or preservatives -pregnant or trying to get pregnant -breast-feeding How should I use this medicine? Take this medicine by mouth with a glass of water, within 30 minutes of the end of a meal. Do not cut, crush or chew this medicine. Follow the directions on the prescription label. Take your medicine at regular intervals. Do not take it more often than directed. Do not stop taking except on your doctor's advice. Your doctor may want you to take a combination of 150 mg and 500 mg tablets for each dose. It is very important that you know how to correctly take your dose. Taking the wrong tablets could result in an overdose (too much medication) or underdose (too little medication). Talk to your pediatrician regarding the use of this medicine in children. Special care may be needed. Overdosage: If you think you have taken too much of this medicine contact a poison control center or emergency room at once. NOTE: This medicine is only for you. Do not share this medicine with others. What if I miss a dose? If you miss a dose, do not take the missed dose at all. Do not take double or extra doses. Instead, continue with your next scheduled dose and check with your doctor. What may interact with this medicine? -antacids with aluminum and/or magnesium -folic acid -leucovorin -medicines to increase blood counts like filgrastim, pegfilgrastim, sargramostim -phenytoin -vaccines -warfarin Talk to your doctor or health care professional before taking any of these medicines: -acetaminophen -aspirin -ibuprofen -ketoprofen -naproxen This list may not describe all possible interactions. Give your health care provider a list of all the medicines, herbs, non-prescription drugs, or dietary supplements you use. Also tell them if you smoke, drink alcohol, or use illegal drugs. Some items may interact with your medicine. What should I watch for while using this medicine? Visit your  doctor for checks on your progress. This drug may make you feel generally unwell. This is not uncommon, as chemotherapy can affect healthy cells as well as cancer cells. Report any side effects. Continue your course of treatment even though you feel ill unless your doctor tells you to stop. In some cases, you may be given additional medicines to help with side effects. Follow all directions for their use. Call your doctor or health care professional for advice if you get a fever, chills or sore throat, or other symptoms of a cold or flu. Do not treat yourself. This drug decreases your body's ability to fight infections. Try to avoid being around people who are sick. This medicine may increase  your risk to bruise or bleed. Call your doctor or health care professional if you notice any unusual bleeding. Be careful brushing and flossing your teeth or using a toothpick because you may get an infection or bleed more easily. If you have any dental work done, tell your dentist you are receiving this medicine. Avoid taking products that contain aspirin, acetaminophen, ibuprofen, naproxen, or ketoprofen unless instructed by your doctor. These medicines may hide a fever. Do not become pregnant while taking this medicine. Women should inform their doctor if they wish to become pregnant or think they might be pregnant. There is a potential for serious side effects to an unborn child. Talk to your health care professional or pharmacist for more information. Do not breast-feed an infant while taking this medicine. Men are advised not to father a child while taking this medicine. What side effects may I notice from receiving this medicine? Side effects that you should report to your doctor or health care professional as soon as possible: -allergic reactions like skin rash, itching or hives, swelling of the face, lips, or tongue -low blood counts - this medicine may decrease the number of white blood cells, red blood  cells and platelets. You may be at increased risk for infections and bleeding. -signs of infection - fever or chills, cough, sore throat, pain or difficulty passing urine -signs of decreased platelets or bleeding - bruising, pinpoint red spots on the skin, black, tarry stools, blood in the urine -signs of decreased red blood cells - unusually weak or tired, fainting spells, lightheadedness -breathing problems -changes in vision -chest pain -dark urine -diarrhea of more than 4 bowel movements in one day or any diarrhea at night; bloody or watery diarrhea -dizziness -mouth sores -nausea and vomiting -pain, swelling, redness at site where injected -pain, tingling, numbness in the hands or feet -redness, swelling, or sores on hands or feet -stomach pain -vomiting -yellow color of skin or eyes Side effects that usually do not require medical attention (report to your doctor or health care professional if they continue or are bothersome): -constipation -diarrhea -dry or itchy skin -hair loss -loss of appetite -nausea -weak or tired This list may not describe all possible side effects. Call your doctor for medical advice about side effects. You may report side effects to FDA at 1-800-FDA-1088. Where should I keep my medicine? Keep out of the reach of children. Store at room temperature between 15 and 30 degrees C (59 and 86 degrees F). Keep container tightly closed. Throw away any unused medicine after the expiration date. NOTE: This sheet is a summary. It may not cover all possible information. If you have questions about this medicine, talk to your doctor, pharmacist, or health care provider.  2015, Elsevier/Gold Standard. (2013-01-13 12:47:46) Ondansetron tablets What is this medicine? ONDANSETRON (on DAN se tron) is used to treat nausea and vomiting caused by chemotherapy. It is also used to prevent or treat nausea and vomiting after surgery. This medicine may be used for other  purposes; ask your health care provider or pharmacist if you have questions. COMMON BRAND NAME(S): Zofran What should I tell my health care provider before I take this medicine? They need to know if you have any of these conditions: -heart disease -history of irregular heartbeat -liver disease -low levels of magnesium or potassium in the blood -an unusual or allergic reaction to ondansetron, granisetron, other medicines, foods, dyes, or preservatives -pregnant or trying to get pregnant -breast-feeding How should I use this  medicine? Take this medicine by mouth with a glass of water. Follow the directions on your prescription label. Take your doses at regular intervals. Do not take your medicine more often than directed. Talk to your pediatrician regarding the use of this medicine in children. Special care may be needed. Overdosage: If you think you have taken too much of this medicine contact a poison control center or emergency room at once. NOTE: This medicine is only for you. Do not share this medicine with others. What if I miss a dose? If you miss a dose, take it as soon as you can. If it is almost time for your next dose, take only that dose. Do not take double or extra doses. What may interact with this medicine? Do not take this medicine with any of the following medications: -apomorphine -certain medicines for fungal infections like fluconazole, itraconazole, ketoconazole, posaconazole, voriconazole -cisapride -dofetilide -dronedarone -pimozide -thioridazine -ziprasidone This medicine may also interact with the following medications: -carbamazepine -certain medicines for depression, anxiety, or psychotic disturbances -fentanyl -linezolid -MAOIs like Carbex, Eldepryl, Marplan, Nardil, and Parnate -methylene blue (injected into a vein) -other medicines that prolong the QT interval (cause an abnormal heart rhythm) -phenytoin -rifampicin -tramadol This list may not  describe all possible interactions. Give your health care provider a list of all the medicines, herbs, non-prescription drugs, or dietary supplements you use. Also tell them if you smoke, drink alcohol, or use illegal drugs. Some items may interact with your medicine. What should I watch for while using this medicine? Check with your doctor or health care professional right away if you have any sign of an allergic reaction. What side effects may I notice from receiving this medicine? Side effects that you should report to your doctor or health care professional as soon as possible: -allergic reactions like skin rash, itching or hives, swelling of the face, lips or tongue -breathing problems -confusion -dizziness -fast or irregular heartbeat -feeling faint or lightheaded, falls -fever and chills -loss of balance or coordination -seizures -sweating -swelling of the hands or feet -tightness in the chest -tremors -unusually weak or tired Side effects that usually do not require medical attention (report to your doctor or health care professional if they continue or are bothersome): -constipation or diarrhea -headache This list may not describe all possible side effects. Call your doctor for medical advice about side effects. You may report side effects to FDA at 1-800-FDA-1088. Where should I keep my medicine? Keep out of the reach of children. Store between 2 and 30 degrees C (36 and 86 degrees F). Throw away any unused medicine after the expiration date. NOTE: This sheet is a summary. It may not cover all possible information. If you have questions about this medicine, talk to your doctor, pharmacist, or health care provider.  2015, Elsevier/Gold Standard. (2012-12-14 16:27:45)

## 2014-07-20 ENCOUNTER — Encounter: Payer: Self-pay | Admitting: *Deleted

## 2014-07-23 ENCOUNTER — Encounter: Payer: Self-pay | Admitting: Internal Medicine

## 2014-07-23 ENCOUNTER — Ambulatory Visit (INDEPENDENT_AMBULATORY_CARE_PROVIDER_SITE_OTHER): Payer: Medicare Other | Admitting: Internal Medicine

## 2014-07-23 VITALS — BP 109/60 | HR 84 | Ht 74.0 in | Wt 281.0 lb

## 2014-07-23 DIAGNOSIS — C187 Malignant neoplasm of sigmoid colon: Secondary | ICD-10-CM

## 2014-07-23 DIAGNOSIS — R1031 Right lower quadrant pain: Secondary | ICD-10-CM

## 2014-07-23 DIAGNOSIS — K5900 Constipation, unspecified: Secondary | ICD-10-CM | POA: Diagnosis not present

## 2014-07-23 DIAGNOSIS — K5190 Ulcerative colitis, unspecified, without complications: Secondary | ICD-10-CM | POA: Diagnosis not present

## 2014-07-23 DIAGNOSIS — G8929 Other chronic pain: Secondary | ICD-10-CM

## 2014-07-23 DIAGNOSIS — R1032 Left lower quadrant pain: Secondary | ICD-10-CM

## 2014-07-23 NOTE — Patient Instructions (Signed)
As discussed with Dr. Henrene Pastor, increase your Miralax and follow up as needed

## 2014-07-23 NOTE — Progress Notes (Signed)
HISTORY OF PRESENT ILLNESS:  Andrew Walls is a 54 y.o. male with multiple significant medical problems who was seen in this office 04/06/2014 regarding a 3-4 month history of vague lower abdominal burning discomfort. 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 Other medical problems include coronary artery disease with prior stent placement for which she is on Plavix. At time of his last visit, CT scan was obtained and unfortunately revealed new metastatic disease to the liver. This was biopsied confirm. He is seeing oncology anticipating palliative chemotherapy to begin next week. Apparently he received a routine recall colonoscopy letter from our office and thus made this appointment. Overall, he feels poorly and is depressed. Decreased appetite. Hospitalized with acute disc disease in the lower back. Now on narcotics. Is having problems with constipation. Was having nausea with vomiting, which has improved. He does have antiemetics. No GI bleeding.  REVIEW OF SYSTEMS:  All non-GI ROS negative except for back pain, visual change, confusion, depression, shortness of breath, night sweats, muscle cramp, itching, hearing problems, headaches, fever, skin rash, ankle edema  Past Medical History  Diagnosis Date  . Diabetes mellitus 08/03/2011  . Morbid obesity with BMI of 40.0-44.9, adult 08/03/2011  . Diverticulitis of colon 2006,2011,2012,2013    ?really perforated colon cancer  . Steatohepatitis   . Anxiety   . Depression   . Blood transfusion without reported diagnosis     age 31  . COPD (chronic obstructive pulmonary disease)   . Hypertension   . Recurrent boils   . Ulcerative colitis   . Coronary atherosclerosis     unspecified type of vessel, native or graft  . Cancer of sigmoid colon, s/p colectomy [id number], pT3pN0 (0/8 LN) 03/2011  . Cataract   . Seizures   . CVA (cerebral vascular  accident)   . Metastatic adenocarcinoma to liver     Past Surgical History  Procedure Laterality Date  . Abdominal surgery    . Colon surgery  04/23/2011    left colectomy  . Colonoscopy    . Polypectomy    . Fracture left leg    . Fracture left arm    . Tonsillectomy    . Cardiac catheterization      with stent placemnet    Social History Andrew Walls  reports that he has never smoked. He has never used smokeless tobacco. He reports that he does not drink alcohol or use illicit drugs.  family history includes Cancer in his father; Heart disease in his maternal grandmother and mother. There is no history of Colon cancer, Rectal cancer, or Stomach cancer.  Allergies  Allergen Reactions  . Neosporin [Neomycin-Bacitracin Zn-Polymyx]   . Sulfamethoxazole Rash       PHYSICAL EXAMINATION: Vital signs: BP 109/60 mmHg  Pulse 84  Ht 6\' 2"  (1.88 m)  Wt 281 lb (127.461 kg)  BMI 36.06 kg/m2 General: Well-developed, well-nourished, no acute distress. Depressed-appearing HEENT: Sclerae are anicteric, conjunctiva pink. Oral mucosa intact. Sores below the lower lip Lungs: Clear Heart: Regular Abdomen: soft, obese, nontender, nondistended, no obvious ascites, no peritoneal signs, normal bowel sounds. No organomegaly. Extremities: No edema Psychiatric: alert and oriented x3. Cooperative   ASSESSMENT:  #1. Metastatic (to the liver) colon cancer #2. History of ulcerative colitis, universal. Quiescent on mesalamine #3. Narcotic related constipation #4. Multiple medical problems  PLAN:  #1. Recommended bowel regimen to include titrating MiraLAX to avoid constipation #  2. Return to the care of his oncology team. GI follow-up as needed #3. No indication for routine surveillance colonoscopy given his metastatic disease

## 2014-07-25 ENCOUNTER — Ambulatory Visit: Payer: Medicare Other | Admitting: Neurology

## 2014-07-25 ENCOUNTER — Other Ambulatory Visit (HOSPITAL_COMMUNITY): Payer: Self-pay | Admitting: Hematology & Oncology

## 2014-07-25 ENCOUNTER — Ambulatory Visit (HOSPITAL_COMMUNITY): Payer: Medicare Other

## 2014-07-27 ENCOUNTER — Other Ambulatory Visit (HOSPITAL_COMMUNITY): Payer: Self-pay | Admitting: Oncology

## 2014-07-27 DIAGNOSIS — F329 Major depressive disorder, single episode, unspecified: Secondary | ICD-10-CM

## 2014-07-27 DIAGNOSIS — F32A Depression, unspecified: Secondary | ICD-10-CM

## 2014-07-27 MED ORDER — DULOXETINE HCL 30 MG PO CPEP: 60.0000 mg | ORAL_CAPSULE | Freq: Every day | ORAL | Status: DC

## 2014-08-06 ENCOUNTER — Encounter (HOSPITAL_COMMUNITY): Payer: Medicare Other

## 2014-08-06 ENCOUNTER — Encounter (HOSPITAL_COMMUNITY): Payer: Self-pay | Admitting: Hematology & Oncology

## 2014-08-06 ENCOUNTER — Encounter (HOSPITAL_COMMUNITY): Payer: Medicare Other | Attending: Hematology & Oncology | Admitting: Hematology & Oncology

## 2014-08-06 VITALS — BP 96/82 | HR 103 | Temp 97.7°F | Resp 18 | Wt 274.6 lb

## 2014-08-06 DIAGNOSIS — C787 Secondary malignant neoplasm of liver and intrahepatic bile duct: Secondary | ICD-10-CM | POA: Diagnosis not present

## 2014-08-06 DIAGNOSIS — C187 Malignant neoplasm of sigmoid colon: Secondary | ICD-10-CM

## 2014-08-06 DIAGNOSIS — K5190 Ulcerative colitis, unspecified, without complications: Secondary | ICD-10-CM | POA: Diagnosis not present

## 2014-08-06 DIAGNOSIS — K51919 Ulcerative colitis, unspecified with unspecified complications: Secondary | ICD-10-CM

## 2014-08-06 DIAGNOSIS — E038 Other specified hypothyroidism: Secondary | ICD-10-CM

## 2014-08-06 LAB — COMPREHENSIVE METABOLIC PANEL
ALT: 76 U/L — ABNORMAL HIGH (ref 17–63)
AST: 56 U/L — ABNORMAL HIGH (ref 15–41)
Albumin: 3.9 g/dL (ref 3.5–5.0)
Alkaline Phosphatase: 322 U/L — ABNORMAL HIGH (ref 38–126)
Anion gap: 13 (ref 5–15)
BUN: 18 mg/dL (ref 6–20)
CALCIUM: 9.3 mg/dL (ref 8.9–10.3)
CO2: 23 mmol/L (ref 22–32)
CREATININE: 1.3 mg/dL — AB (ref 0.61–1.24)
Chloride: 100 mmol/L — ABNORMAL LOW (ref 101–111)
GFR calc Af Amer: >60 mL/min (ref >60)
GLUCOSE: 172 mg/dL — AB (ref 65–99)
Potassium: 4.5 mmol/L (ref 3.5–5.1)
Sodium: 136 mmol/L (ref 135–145)
Total Bilirubin: 1.8 mg/dL — ABNORMAL HIGH (ref 0.3–1.2)
Total Protein: 8.1 g/dL (ref 6.5–8.1)

## 2014-08-06 LAB — CBC WITH DIFFERENTIAL/PLATELET
Basophils Absolute: 0 10*3/uL (ref 0.0–0.1)
Basophils Relative: 0 % (ref 0–1)
Eosinophils Absolute: 0.2 10*3/uL (ref 0.0–0.7)
Eosinophils Relative: 2 % (ref 0–5)
HEMATOCRIT: 37.8 % — AB (ref 39.0–52.0)
HEMOGLOBIN: 11.3 g/dL — AB (ref 13.0–17.0)
LYMPHS ABS: 1 10*3/uL (ref 0.7–4.0)
LYMPHS PCT: 10 % — AB (ref 12–46)
MCH: 24.7 pg — AB (ref 26.0–34.0)
MCHC: 29.9 g/dL — AB (ref 30.0–36.0)
MCV: 82.7 fL (ref 78.0–100.0)
MONO ABS: 0.5 10*3/uL (ref 0.1–1.0)
Monocytes Relative: 5 % (ref 3–12)
NEUTROS PCT: 83 % — AB (ref 43–77)
Neutro Abs: 8 10*3/uL — ABNORMAL HIGH (ref 1.7–7.7)
Platelets: 438 10*3/uL — ABNORMAL HIGH (ref 150–400)
RBC: 4.57 MIL/uL (ref 4.22–5.81)
RDW: 15.7 % — ABNORMAL HIGH (ref 11.5–15.5)
WBC: 9.6 10*3/uL (ref 4.0–10.5)

## 2014-08-06 LAB — TSH: TSH: 1.281 u[IU]/mL (ref 0.350–4.500)

## 2014-08-06 NOTE — Progress Notes (Signed)
Andrew Walls,Andrew B., MD  DIAGNOSIS: CRC with history of sigmoid resection 03/2011 Ulcerative colitis diagnosed April 2014 CT A/P on 04/11/2014 with new liver metastases, new LAD in the portacaval space and retroperitoneum MRI of the head with and without contrast at [facility] on 05/03/2014 with no acute intracranial abnormality, chronic left parietal infarct CAD, fixed defect involving the inferior wall as well as the apex at the septum Multiple syncopal events Ultrasound guided liver biopsy on 06/13/2014   CURRENT THERAPY: None   INTERVAL HISTORY: Andrew Walls 54 y.o. male returns for follow up.   He continues to struggle with depression. He continues to have chronic back pain. He complains of nausea that has also been chronic.  Lately he has been having vomiting spells where he has the sudden urge to vomit, he is unsure what is causing it. He doesn't think that it is the antidepressant medication or anxiety. The previously prescribed nausea medication isn't really helping.  He has been taking insulin even without having eaten much. His support friends have been instructing him not to take his insulin without eating and have been monitoring this.  Went to the ER last Wednesday due to the sudden inability to stand and inability to hold his urine. He says they inserted a catheter and sent him home. He says he has been staggering a lot and has to hold on to things to move about since the ER visit. He has a history of pseudoseizures.   He is still taking the Cymbalta and denies any significant side effects.  The time he goes to bed and wakes up varies, he doesn't have a set sleep schedule but this isn't anything new. He mostly sits around and watches TV at the house. He doesn't get out walking much. A physical therapist was supposed to stop by but they haven't come. He states home health was ordered by his primary physician. He is unable to make it to physical therapy here at the  hospital due to his limited transportation.  MEDICAL HISTORY: Past Medical History  Diagnosis Date  . Diabetes mellitus 08/03/2011  . Morbid obesity with BMI of 40.0-44.9, adult 08/03/2011  . Diverticulitis of colon 2006,2011,2012,2013    ?really perforated colon cancer  . Steatohepatitis   . Anxiety   . Depression   . Blood transfusion without reported diagnosis     age 67  . COPD (chronic obstructive pulmonary disease)   . Hypertension   . Recurrent boils   . Ulcerative colitis   . Coronary atherosclerosis     unspecified type of vessel, native or graft  . Cancer of sigmoid colon, s/p colectomy [id number], pT3pN0 (0/8 LN) 03/2011  . Cataract   . Seizures   . CVA (cerebral vascular accident)   . Metastatic adenocarcinoma to liver     has Diabetes mellitus; Cancer of sigmoid colon, s/p colectomy Jan2013, cT4pN0 (5.5cm, 0/18 LN), [place] (-); Morbid obesity with BMI of 40.0-44.9, adult; Dizziness - light-headed; Diabetic neuropathy, type II diabetes mellitus; Depression; Postoperative intra-abdominal abscess; Steatohepatitis; Diarrhea; Abdominal pain, chronic, left lower quadrant; Anxiety; Muscle twitching - Panic attacks vs seizures; Abdominal pain, chronic, right lower quadrant; Back pain; Headache(784.0); Transient alteration of awareness; and Dizziness on his problem list.     is allergic to neosporin and sulfamethoxazole.    SURGICAL HISTORY: Past Surgical History  Procedure Laterality Date  . Abdominal surgery    . Colon surgery  04/23/2011    left colectomy  . Colonoscopy    .  Polypectomy    . Fracture left leg    . Fracture left arm    . Tonsillectomy    . Cardiac catheterization      with stent placemnet    SOCIAL HISTORY: History   Social History  . Marital Status: Single    Spouse Name: N/A  . Number of Children: N/A  . Years of Education: N/A   Occupational History  . Not on file.   Social History Main Topics  . Smoking status: Never Smoker   .  Smokeless tobacco: Never Used  . Alcohol Use: No  . Drug Use: No  . Sexual Activity: Not on file   Other Topics Concern  . Not on file   Social History Narrative    FAMILY HISTORY: Family History  Problem Relation Age of Onset  . Heart disease Mother   . Cancer Father     melanoma  . Heart disease Maternal Grandmother   . Colon cancer Neg Hx   . Rectal cancer Neg Hx   . Stomach cancer Neg Hx     Review of Systems  Constitutional: Positive for malaise/fatigue. Positive for weight loss. HENT: Negative.   Eyes: Negative.   Respiratory:  Negative. Cardiovascular: Negative.   Gastrointestinal: Positive for nausea. Positive for vomiting. Genitourinary:  Negative. Musculoskeletal: Positive for joint pain.  Skin: Negative.   Neurological: Negative.   Endo/Heme/Allergies: Negative.   Psychiatric/Behavioral: Positive for depression. The patient is nervous/anxious and has insomnia.     PHYSICAL EXAMINATION  ECOG PERFORMANCE STATUS: 1 - Symptomatic but completely ambulatory  Filed Vitals:   08/06/14 1417  BP: 96/82  Pulse: 103  Temp: 97.7 F (36.5 C)  Resp: 18    Physical Exam  Constitutional: He is oriented to person, place, and time and well-developed, well-nourished, and in no distress.      Obese, flat affect  HENT:  Head: Normocephalic and atraumatic.  Nose: Nose normal.  Mouth/Throat: Oropharynx is clear and moist. No oropharyngeal exudate.  Eyes: Conjunctivae and EOM are normal. Pupils are equal, round, and reactive to light. Right eye exhibits no discharge. Left eye exhibits no discharge. No scleral icterus.  Neck: Normal range of motion. Neck supple. No tracheal deviation present. No thyromegaly present.  Cardiovascular: Normal rate, regular rhythm and normal heart sounds.  Exam reveals no gallop and no friction rub.   No murmur heard. Pulmonary/Chest: Effort normal and breath sounds normal. He has no wheezes. He has no rales.  Abdominal: Soft. Bowel sounds  are normal. He exhibits no distension and no mass. There is no tenderness. There is no rebound and no guarding.      Obese  Musculoskeletal: Normal range of motion. He exhibits no edema.  Lymphadenopathy:    He has no cervical adenopathy.  Neurological: He is alert and oriented to person, place, and time. He has normal reflexes. No cranial nerve deficit. Gait normal. Coordination normal.  Skin: Skin is warm and dry. Erythematous scaling rash on the posterior aspect of both hands (patient states he is allergic to the sun) Psychiatric: Memory normal.  Nursing note and vitals reviewed.   LABORATORY DATA:  CBC    Component Value Date/Time   WBC 9.6 08/06/2014 1600   RBC 4.57 08/06/2014 1600   HGB 11.3* 08/06/2014 1600   HCT 37.8* 08/06/2014 1600   PLT 438* 08/06/2014 1600   MCV 82.7 08/06/2014 1600   MCH 24.7* 08/06/2014 1600   MCHC 29.9* 08/06/2014 1600   RDW 15.7* 08/06/2014 1600  LYMPHSABS 1.0 08/06/2014 1600   MONOABS 0.5 08/06/2014 1600   EOSABS 0.2 08/06/2014 1600   BASOSABS 0.0 08/06/2014 1600   CMP     Component Value Date/Time   NA 136 08/06/2014 1600   K 4.5 08/06/2014 1600   CL 100* 08/06/2014 1600   CO2 23 08/06/2014 1600   GLUCOSE 172* 08/06/2014 1600   BUN 18 08/06/2014 1600   CREATININE 1.30* 08/06/2014 1600   CREATININE 1.06 11/27/2011 1244   CALCIUM 9.3 08/06/2014 1600   PROT 8.1 08/06/2014 1600   ALBUMIN 3.9 08/06/2014 1600   AST 56* 08/06/2014 1600   ALT 76* 08/06/2014 1600   ALKPHOS 322* 08/06/2014 1600   BILITOT 1.8* 08/06/2014 1600   GFRNONAA >60 08/06/2014 1600   GFRAA >60 08/06/2014 1600    RADIOGRAPHIC STUDIES: CLINICAL DATA: 54 year old with colon cancer and liver lesions. Evaluate for metastatic disease.  EXAM: ULTRASOUND-GUIDED LIVER LESION BIOPSY  Physician: Stephan Minister. Anselm Pancoast, MD  FLUOROSCOPY TIME: None  MEDICATIONS: 1 mg versed, 100 mcg fentanyl. A radiology nurse monitored the patient for moderate  sedation.  ANESTHESIA/SEDATION: Moderate sedation time: 20 minutes  PROCEDURE: The procedure was explained to the patient. The risks and benefits of the procedure were discussed and the patient's questions were addressed. Informed consent was obtained from the patient. The patient's liver was evaluated with ultrasound. The right side of the abdomen was prepped and draped in a sterile fashion. A sterile field was created. The skin and liver capsule were anesthetized with 1% lidocaine. A 17 gauge coaxial needle was directed into the right hepatic lobe with ultrasound guidance. Needle was positioned in the hypoechoic posterior right hepatic mass. A total of 3 core biopsies were obtained with an 18 gauge core device. Specimens were placed in formalin. 17 gauge needle was removed without complication. Bandage placed over the puncture site.  FINDINGS: Large hypoechoic lesion in posterior right hepatic lobe. Needle position confirmed within this lesion. No significant bleeding following the core biopsies.  COMPLICATIONS: None  IMPRESSION: Ultrasound-guided core biopsies of a right hepatic lesion.   Electronically Signed  By: Markus Daft M.D. No results found.   PATHOLOGY: Diagnosis Liver, needle/core biopsy, right lobe - METASTATIC ADENOCARCINOMA, SEE COMMENT.    ASSESSMENT and THERAPY PLAN:   Stage IV colon cancer Ulcerative colitis Liver biopsy with biopsy proven metastatic disease Chronic depression, has been treated by [facility] behavioral health  I discussed with the patient a reservation about his ability to tolerate chemotherapy because of his multiple other comorbidities. I discussed with him that I feel his depression is a significant issue and I feel he needs to work with psychiatry. I have recommended that he also speak with our social worker and will have her touch base with him tomorrow.  On one hand he states he does not want to try treatment  and on the other he becomes upset if he does not feel he has an option to do therapy. I am not certain realistically what he wishes to do. We will proceed with XELODA teaching today. He has his family with him today and they will also be present for teaching.  I expressed my concern about his ability to tolerate the XELODA given that he has intermittent vomiting. He does not wish to pursue an evaluation. He will return in one week for reassessment.   All questions were answered. The patient knows to call the clinic with any problems, questions or concerns. We can certainly see the patient much sooner if  necessary. This note was electronically signed.     This document serves as a record of services personally performed by Ancil Linsey, MD. It was created on her behalf by Arlyce Harman, a trained medical scribe. The creation of this record is based on the scribe's personal observations and the provider's statements to them. This document has been checked and approved by the attending provider.  I have reviewed the above documentation for accuracy and completeness, and I agree with the above. Molli Hazard, MD

## 2014-08-06 NOTE — Patient Instructions (Signed)
[place] at [facility] Discharge Instructions  RECOMMENDATIONS MADE BY THE CONSULTANT AND ANY TEST RESULTS WILL BE SENT TO YOUR REFERRING PHYSICIAN.  Return in 1 week to see Dr. Whitney Muse  We are leaving it up to you as to whether or not you take your Xeloda.  Call [place] with any questions that you may have.  [phone]  Labs drawn today: CEA, CMP, CBC diff  If you would get out and walk everyday that would be great  Referral to [place] for Physical Therapy by Dr. Whitney Muse.   We are going to see what Abby Potash can potentially establish for you in regards to counseling with your depression and anxiety.  Thank you for choosing [place] at [facility] to provide your oncology and hematology care.  To afford each patient quality time with our provider, please arrive at least 15 minutes before your scheduled appointment time.    You need to re-schedule your appointment should you arrive 10 or more minutes late.  We strive to give you quality time with our providers, and arriving late affects you and other patients whose appointments are after yours.  Also, if you no show three or more times for appointments you may be dismissed from the clinic at the providers discretion.     Again, thank you for choosing [facility].  Our hope is that these requests will decrease the amount of time that you wait before being seen by our physicians.       _____________________________________________________________  Should you have questions after your visit to [facility], please contact our office at [phone] between the hours of 8:30 a.m. and 4:30 p.m.  Voicemails left after 4:30 p.m. will not be returned until the following business day.  For prescription refill requests, have your pharmacy contact our office.

## 2014-08-06 NOTE — Progress Notes (Signed)
Chemo teaching done and consent signed for Xeloda. Xeloda calendar given to patient. Distress screening done. Orders being placed for [facility] PT. Zofran to be picked up from pharmacy.

## 2014-08-07 ENCOUNTER — Telehealth (HOSPITAL_COMMUNITY): Payer: Self-pay | Admitting: *Deleted

## 2014-08-07 ENCOUNTER — Encounter: Payer: Self-pay | Admitting: *Deleted

## 2014-08-07 LAB — CEA: CEA: 206.8 ng/mL — AB (ref 0.0–4.7)

## 2014-08-07 NOTE — Telephone Encounter (Signed)
I tried to call Linford today and couldn't reach him. Stanton Kidney said she would call him in a few minutes and see if he was going to start taking the pill.

## 2014-08-07 NOTE — Telephone Encounter (Signed)
Patient started taking his Xeloda this am. He plans on taking second dose this pm. He reported to [facility] that after taking the first dose of Xeloda that his mouth felt a little warm and later this evening he was itching some. He took some benadryl to help with the itching. I will contact Stanton Kidney again or Louie Casa tomorrow or Thursday to see how it is going with the Xeloda.

## 2014-08-07 NOTE — Progress Notes (Signed)
[place] Psychosocial Distress Screening Clinical Social Work  Clinical Social Work was referred by distress screening protocol.  The patient scored a 8 on the Psychosocial Distress Thermometer which indicates severe distress. Clinical Social Worker phoned pt to assess for distress and other psychosocial needs. CSW left vm for pt, awaits return call and will attempt to follow up at future appointments.   ONCBCN DISTRESS SCREENING 08/06/2014  Screening Type Initial Screening  Distress experienced in past week (1-10) 8  Practical problem type Housing;Insurance;Transportation;Food  Emotional problem type Depression;Nervousness/Anxiety;Adjusting to illness;Isolation/feeling alone;Feeling hopeless;Boredom;Adjusting to appearance changes  Spiritual/Religous concerns type Loss of sense of purpose  Physical Problem type Pain;Nausea/vomiting;Sleep/insomnia;Getting around;Breathing;Mouth sores/swallowing;Loss of appetitie;Talking;Constipation/diarrhea;Changes in urination;Tingling hands/feet;Skin dry/itchy  Physician notified of physical symptoms Yes  Referral to clinical social work Yes  Referral to dietition Yes  Other (No Data)    Clinical Social Worker follow up needed: Yes.    If yes, follow up plan: See above Loren Racer, [place] Worker [place]  [facility] Phone: [phone] Fax: [phone]

## 2014-08-08 NOTE — Telephone Encounter (Signed)
Agreed -

## 2014-08-09 ENCOUNTER — Telehealth (HOSPITAL_COMMUNITY): Payer: Self-pay | Admitting: *Deleted

## 2014-08-09 NOTE — Telephone Encounter (Signed)
Patient has been taking Xeloda since Tuesday and is doing fine with it. Patient notified to contact me if he has any questions or concerns.

## 2014-08-10 ENCOUNTER — Encounter: Payer: Self-pay | Admitting: Dietician

## 2014-08-10 ENCOUNTER — Telehealth (HOSPITAL_COMMUNITY): Payer: Self-pay

## 2014-08-10 NOTE — Progress Notes (Signed)
Patient identified to be at risk for malnutrition on the MST secondary to Wt loss and poor oral intake  Contacted Pt by Phone  Wt Readings from Last 10 Encounters:  08/06/14 274 lb 9.6 oz (124.558 kg)  07/23/14 281 lb (127.461 kg)  07/04/14 286 lb (129.729 kg)  06/20/14 283 lb 9.6 oz (128.64 kg)  05/23/14 297 lb (134.718 kg)  04/11/14 298 lb (135.172 kg)  04/06/14 300 lb (136.079 kg)  02/28/14 302 lb (136.986 kg)  09/27/13 300 lb (136.079 kg)  08/01/13 302 lb (136.986 kg)   Patient weight has decreased by 24 lbs in 4 months  Pt was too sick to speak, so I spoke with his brother.  Brothers says pt Was sick all last night. Only thing he ate today is banana. He has been drinking Oj and gatorade. Since starting the oral chemotherapy agent. He has not been able to eat meals. He has some severe constipation and has been suffering from severe nausea and smell changes.  We spoke about high protein foods the pt should be eating. Pt is already Eating cheese. Does drink whole milk a lot. No Ensure/Boosts supplements because the pt has not wanted them.   Mailed my contact info, coupons, and handouts titled "Increasing Calories and Protein", "Constipation" and "Nausea and Vomiting"  Burtis Junes RD, LDN Nutrition Pager: [phone] 08/10/2014 2:02 PM

## 2014-08-10 NOTE — Telephone Encounter (Signed)
Spoke with patient and states that he is feeling some better. Is keeping gatorade and water down.  Encouraged to continue use of nausea medication, drink fluids and if condition worsens, to go to the ED for assistance.  Verbalized understanding of instructions.

## 2014-08-10 NOTE — Telephone Encounter (Signed)
Call from brother stating the Ramar has some nausea and vomiting last night also had a fall. So far this am he has been able to keep liquids down.  Instructed to use nausea medication as prescribed and to continue to increase fluid intake.  BP was 86/72 this am.  Instructed to call if symptoms worsen.  Will call back at 3 pm with update on symptoms.

## 2014-08-13 ENCOUNTER — Encounter (HOSPITAL_BASED_OUTPATIENT_CLINIC_OR_DEPARTMENT_OTHER): Payer: Medicare Other | Admitting: Oncology

## 2014-08-13 ENCOUNTER — Encounter (HOSPITAL_COMMUNITY): Payer: Self-pay | Admitting: Oncology

## 2014-08-13 VITALS — BP 76/50 | HR 79 | Temp 97.5°F | Resp 16 | Wt 280.7 lb

## 2014-08-13 DIAGNOSIS — C187 Malignant neoplasm of sigmoid colon: Secondary | ICD-10-CM | POA: Diagnosis present

## 2014-08-13 DIAGNOSIS — C787 Secondary malignant neoplasm of liver and intrahepatic bile duct: Secondary | ICD-10-CM

## 2014-08-13 NOTE — Assessment & Plan Note (Addendum)
Stage IV CRC after being initially diagnosed with earlier stage CRC in 2013 at [facility].  Now with positive liver biopsy for metastatic disease.  Oncology history updated.  Began Xeloda 2500 mg in AM and PM 7 days on and 7 days off on 5/16.  His missed his Saturday dose and I recommended he refrain from adding this missed dose to the end of his treatment cycle.    Labs in 1 weeks: CBC diff, CMET  Due to hypotension, he is to hold his Toprol x 1 week.  We will re-evaluate next week.  He admits to not eating well.  He also reports about 40 oz of fluid intake daily.  I have provided education regarding fluid intake and recommended 60-80 oz of H2O over the next week.  If BP improves with better hydration, we can restart Toprol.  He notes a fall yesterday with signs and symptoms of orthostatic hypotension.  I have encouraged improved oral fluid intake.    Rx provided for bedside commode.  At our next visit, I will need to broach code status and Hospice education.  He notes a pruritis diffusely without a noticeable rash, except on dorsal hands and posterior forearms which is chronic.  Return in 7 days for follow-up and labs.

## 2014-08-13 NOTE — Progress Notes (Signed)
Glenda Chroman., MD [place] [address]  Cancer of sigmoid colon, s/p colectomy [phone], cT4pN0 (5.5cm, 0/18 LN), K-ras (-) - Plan: CBC with Differential, Comprehensive metabolic panel  CURRENT THERAPY: Xeloda 2500 mg in AM and PM 7 days on and 7 days off.  INTERVAL HISTORY: Andrew Walls 54 y.o. 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     Cancer of sigmoid colon, s/p colectomy Jan2013, cT4pN0 (5.5cm, 0/18 LN), K-ras (-)   06/13/2014 Pathology Results Diagnosis Liver, needle/core biopsy, right lobe - METASTATIC ADENOCARCINOMA   08/06/2014 -  Chemotherapy Xeloda 2500 mg in AM and PM; 7 days on and 7 days off.    He reports that he tolerated therapy well with minimal pruritis diffusely.  He does have a rash on dorsal aspects of hands and posterior aspect of distal arms.  He does not have a rash elsewhere on exam.    He missed his Saturday dose and I have asked him to refrain from adding that missed dose to this cycle.  This will keep his calendar schedule the same and not cause confusion.    He notes a fall off of the commode over the weekend.  He denies any significant changes associated with this fall.  Oncologically, he denies any complaints and ROS questioning is negative.  Past Medical History  Diagnosis Date  . Diabetes mellitus 08/03/2011  . Morbid obesity with BMI of 40.0-44.9, adult 08/03/2011  . Diverticulitis of colon 2006,2011,2012,2013    ?really perforated colon cancer  . Steatohepatitis   . Anxiety   . Depression   . Blood transfusion without reported diagnosis     age 42  . COPD (chronic obstructive pulmonary disease)   . Hypertension   . Recurrent boils   . Ulcerative colitis   . Coronary atherosclerosis     unspecified type of vessel, native or graft  . Cancer of sigmoid colon, s/p colectomy [id number], pT3pN0 (0/8 LN) 03/2011  . Cataract   . Seizures   . CVA (cerebral vascular accident)   . Metastatic adenocarcinoma to liver       has Diabetes mellitus; Cancer of sigmoid colon, s/p colectomy Jan2013, cT4pN0 (5.5cm, 0/18 LN), K-ras (-); Morbid obesity with BMI of 40.0-44.9, adult; Dizziness - light-headed; Diabetic neuropathy, type II diabetes mellitus; Depression; Postoperative intra-abdominal abscess; Steatohepatitis; Diarrhea; Abdominal pain, chronic, left lower quadrant; Anxiety; Muscle twitching - Panic attacks vs seizures; Abdominal pain, chronic, right lower quadrant; Back pain; Headache(784.0); Transient alteration of awareness; and Dizziness on his problem list.     is allergic to neosporin and sulfamethoxazole.  Current Outpatient Prescriptions on File Prior to Visit  Medication Sig Dispense Refill  . acetaminophen (TYLENOL) 500 MG tablet Take 500 mg by mouth every 4 (four) hours as needed for mild pain.     Marland Kitchen albuterol (VENTOLIN HFA) 108 (90 BASE) MCG/ACT inhaler Inhale 2 puffs into the lungs every 6 (six) hours as needed for wheezing or shortness of breath.     Marland Kitchen aspirin EC 81 MG tablet Take 81 mg by mouth daily.    Marland Kitchen atorvastatin (LIPITOR) 10 MG tablet Take 10 mg by mouth at bedtime.     . baclofen (LIORESAL) 10 MG tablet Take 10 mg by mouth 3 (three) times daily.    . capecitabine (XELODA) 500 MG tablet Take 5 tablets (2,500 mg total) by mouth 2 (two) times daily after a meal. Take for 7 days on and  7 days off 140 tablet 6  . diphenhydrAMINE (BENADRYL) 25 MG tablet Take 50 mg by mouth at bedtime as needed for itching, allergies or sleep.     . DULoxetine (CYMBALTA) 30 MG capsule Take 2 capsules (60 mg total) by mouth daily. 60 capsule 1  . gabapentin (NEURONTIN) 400 MG capsule Take 400 mg by mouth at bedtime.    . Insulin Lispro, Human, (HUMALOG KWIKPEN [place]) Inject 5-12 Units into the skin 3 (three) times daily before meals. Sliding Scale 90-150 = 5 units  151-200= 6 units 201-250= 7 units 251-300= 8 units 301-350= 9 units 351-400= 10 units As directed per sliding scale instructions    . INVOKANA 100  MG TABS Take 100 mg by mouth daily.     . irbesartan (AVAPRO) 75 MG tablet Take 75 mg by mouth daily.    . LamoTRIgine 100 MG TB24 Take 1 tablet (100 mg total) by mouth daily. 30 tablet 1  . LANTUS SOLOSTAR 100 UNIT/ML SOPN Inject 40 Units into the skin at bedtime.     Marland Kitchen levothyroxine (SYNTHROID, LEVOTHROID) 25 MCG tablet Take 25 mcg by mouth daily before breakfast.    . Liraglutide (VICTOZA) 18 MG/3ML SOPN Inject 1.8 Units into the skin daily.     Marland Kitchen LITETOUCH PEN NEEDLES 31G X 8 MM MISC 4 (four) times daily.  3  . meclizine (ANTIVERT) 25 MG tablet Take 25 mg by mouth as needed for dizziness.    . mesalamine (LIALDA) 1.2 G EC tablet TAKE TWO (2) TABLETS BY MOUTH TWICE DAILY 120 tablet 6  . metoCLOPramide (REGLAN) 5 MG tablet     . metoprolol succinate (TOPROL-XL) 25 MG 24 hr tablet Take 25 mg by mouth daily.  3  . morphine (MS CONTIN) 30 MG 12 hr tablet Take 30 mg by mouth 2 (two) times daily.  0  . naloxegol oxalate (MOVANTIK) 25 MG TABS tablet Take 25 mg by mouth daily.    . nitroGLYCERIN (NITROSTAT) 0.4 MG SL tablet Place 0.4 mg under the tongue every 5 (five) minutes as needed for chest pain.    Marland Kitchen ondansetron (ZOFRAN) 8 MG tablet Take 1 tablet (8 mg total) by mouth every 8 (eight) hours as needed for nausea or vomiting. 45 tablet 1  . oxyCODONE (ROXICODONE) 15 MG immediate release tablet Take 15 mg by mouth 3 (three) times daily as needed for pain.   0  . polyethylene glycol powder (GLYCOLAX/MIRALAX) powder Take 1 Container by mouth as needed. Pt takes 2 capsules for constipation    . PRODIGY TWIST TOP LANCETS 28G MISC daily.   12  . triamcinolone cream (KENALOG) 0.1 % Apply 1 application topically 2 (two) times daily.    . halobetasol (ULTRAVATE) 0.05 % cream Apply 1 application topically daily as needed (for irritation).    . promethazine (PHENERGAN) 25 MG tablet Take 25 mg by mouth every 6 (six) hours as needed for nausea or vomiting.     No current facility-administered medications on  file prior to visit.    Past Surgical History  Procedure Laterality Date  . Abdominal surgery    . Colon surgery  04/23/2011    left colectomy  . Colonoscopy    . Polypectomy    . Fracture left leg    . Fracture left arm    . Tonsillectomy    . Cardiac catheterization      with stent placemnet    Denies any headaches, dizziness, double vision, fevers, chills, night sweats,  nausea, vomiting, diarrhea, constipation, chest pain, heart palpitations, shortness of breath, blood in stool, black tarry stool, urinary pain, urinary burning, urinary frequency, hematuria.   PHYSICAL EXAMINATION  ECOG PERFORMANCE STATUS: 1 - Symptomatic but completely ambulatory  Filed Vitals:   08/13/14 1000  BP: 76/50  Pulse: 79  Temp: 97.5 F (36.4 C)  Resp: 16    GENERAL:alert, no distress, comfortable, cooperative, obese and smiling, accompanied by brother and niece. SKIN: skin color, texture, turgor are normal, positive for: dorsal aspect of hands and posterior forearm B/L that is erythematous and hyperkeratotic.   HEAD: Normocephalic, No masses, lesions, tenderness or abnormalities EYES: normal, PERRLA, EOMI, Conjunctiva are pink and non-injected EARS: External ears normal OROPHARYNX:lips, buccal mucosa, and tongue normal and mucous membranes are moist  NECK: supple, no adenopathy, trachea midline LYMPH:  no palpable lymphadenopathy BREAST:not examined LUNGS: clear to auscultation  HEART: regular rate & rhythm ABDOMEN:abdomen soft, obese and normal bowel sounds BACK: Back symmetric, no curvature. EXTREMITIES:less then 2 second capillary refill, no joint deformities, effusion, or inflammation, no skin discoloration, no cyanosis  NEURO: alert & oriented x 3 with fluent speech, no focal motor/sensory deficits   LABORATORY DATA: CBC    Component Value Date/Time   WBC 9.6 08/06/2014 1600   RBC 4.57 08/06/2014 1600   HGB 11.3* 08/06/2014 1600   HCT 37.8* 08/06/2014 1600   PLT 438*  08/06/2014 1600   MCV 82.7 08/06/2014 1600   MCH 24.7* 08/06/2014 1600   MCHC 29.9* 08/06/2014 1600   RDW 15.7* 08/06/2014 1600   LYMPHSABS 1.0 08/06/2014 1600   MONOABS 0.5 08/06/2014 1600   EOSABS 0.2 08/06/2014 1600   BASOSABS 0.0 08/06/2014 1600      Chemistry      Component Value Date/Time   NA 136 08/06/2014 1600   K 4.5 08/06/2014 1600   CL 100* 08/06/2014 1600   CO2 23 08/06/2014 1600   BUN 18 08/06/2014 1600   CREATININE 1.30* 08/06/2014 1600   CREATININE 1.06 11/27/2011 1244      Component Value Date/Time   CALCIUM 9.3 08/06/2014 1600   ALKPHOS 322* 08/06/2014 1600   AST 56* 08/06/2014 1600   ALT 76* 08/06/2014 1600   BILITOT 1.8* 08/06/2014 1600     Lab Results  Component Value Date   CEA 206.8* 08/06/2014       ASSESSMENT AND PLAN:  Cancer of sigmoid colon, s/p colectomy Jan2013, cT4pN0 (5.5cm, 0/18 LN), K-ras (-) Stage IV CRC after being initially diagnosed with earlier stage CRC in 2013 at [facility].  Now with positive liver biopsy for metastatic disease.  Oncology history updated.  Began Xeloda 2500 mg in AM and PM 7 days on and 7 days off on 5/16.  His missed his Saturday dose and I recommended he refrain from adding this missed dose to the end of his treatment cycle.    Labs in 1 weeks: CBC diff, CMET  Due to hypotension, he is to hold his Toprol x 1 week.  We will re-evaluate next week.  He admits to not eating well.  He also reports about 40 oz of fluid intake daily.  I have provided education regarding fluid intake and recommended 60-80 oz of H2O over the next week.  If BP improves with better hydration, we can restart Toprol.  He notes a fall yesterday with signs and symptoms of orthostatic hypotension.  I have encouraged improved oral fluid intake.    Rx provided for bedside commode.  At our next  visit, I will need to broach code status and Hospice education.  He notes a pruritis diffusely without a noticeable rash, except on dorsal  hands and posterior forearms which is chronic.  Return in 7 days for follow-up and labs.   THERAPY PLAN: He starts his 1 week break from therapy today or tomorrow.  I will see him back in 1 week.  He would benefit most from Hospice/Palliative intervention.  All questions were answered. The patient knows to call the clinic with any problems, questions or concerns. We can certainly see the patient much sooner if necessary.  Patient and plan discussed with Dr. Ancil Linsey and she is in agreement with the aforementioned.   This note is electronically signed by: Doy Mince 08/13/2014 11:56 AM

## 2014-08-13 NOTE — Patient Instructions (Signed)
[place] at [facility] Discharge Instructions  RECOMMENDATIONS MADE BY THE CONSULTANT AND ANY TEST RESULTS WILL BE SENT TO YOUR REFERRING PHYSICIAN.  Exam and discussion by Robynn Pane, PA-c Do not take your Toprol for 1 week Increase your fluid intake to 60 - 80 ounces per 24 hours. Rx for bedside comode. Follow-up in 1 week with labs and office visit.  Thank you for choosing [place] at [facility] to provide your oncology and hematology care.  To afford each patient quality time with our provider, please arrive at least 15 minutes before your scheduled appointment time.    You need to re-schedule your appointment should you arrive 10 or more minutes late.  We strive to give you quality time with our providers, and arriving late affects you and other patients whose appointments are after yours.  Also, if you no show three or more times for appointments you may be dismissed from the clinic at the providers discretion.     Again, thank you for choosing [facility].  Our hope is that these requests will decrease the amount of time that you wait before being seen by our physicians.       _____________________________________________________________  Should you have questions after your visit to [facility], please contact our office at [phone] between the hours of 8:30 a.m. and 4:30 p.m.  Voicemails left after 4:30 p.m. will not be returned until the following business day.  For prescription refill requests, have your pharmacy contact our office.

## 2014-08-14 ENCOUNTER — Ambulatory Visit (HOSPITAL_COMMUNITY): Payer: Medicare Other | Admitting: Oncology

## 2014-08-17 ENCOUNTER — Encounter: Payer: Self-pay | Admitting: Dietician

## 2014-08-17 NOTE — Progress Notes (Signed)
Per nurse, pt had asked about case of Ensure at last appt. Contacted pt and let him knew about the ensure program that [facility] runs. He said he has just recently tried the supplements and they are pretty good. He requested a case of Strawberry for his next appt Monday. Will order  Burtis Junes RD, LDN Nutrition Pager: [phone] 08/17/2014 1:30 PM

## 2014-08-22 ENCOUNTER — Encounter (HOSPITAL_COMMUNITY): Payer: Self-pay | Admitting: Oncology

## 2014-08-22 ENCOUNTER — Encounter (HOSPITAL_BASED_OUTPATIENT_CLINIC_OR_DEPARTMENT_OTHER): Payer: Medicare Other

## 2014-08-22 ENCOUNTER — Encounter (HOSPITAL_COMMUNITY): Payer: Medicare Other | Attending: Hematology & Oncology | Admitting: Oncology

## 2014-08-22 VITALS — BP 108/73 | HR 84 | Temp 97.7°F | Resp 16 | Wt 274.5 lb

## 2014-08-22 DIAGNOSIS — C187 Malignant neoplasm of sigmoid colon: Secondary | ICD-10-CM | POA: Diagnosis present

## 2014-08-22 DIAGNOSIS — K5190 Ulcerative colitis, unspecified, without complications: Secondary | ICD-10-CM | POA: Insufficient documentation

## 2014-08-22 DIAGNOSIS — C787 Secondary malignant neoplasm of liver and intrahepatic bile duct: Secondary | ICD-10-CM

## 2014-08-22 LAB — COMPREHENSIVE METABOLIC PANEL
ALBUMIN: 3.8 g/dL (ref 3.5–5.0)
ALK PHOS: 347 U/L — AB (ref 38–126)
ALT: 105 U/L — ABNORMAL HIGH (ref 17–63)
AST: 74 U/L — ABNORMAL HIGH (ref 15–41)
Anion gap: 12 (ref 5–15)
BILIRUBIN TOTAL: 1.1 mg/dL (ref 0.3–1.2)
BUN: 23 mg/dL — ABNORMAL HIGH (ref 6–20)
CALCIUM: 9.3 mg/dL (ref 8.9–10.3)
CHLORIDE: 99 mmol/L — AB (ref 101–111)
CO2: 27 mmol/L (ref 22–32)
Creatinine, Ser: 1.44 mg/dL — ABNORMAL HIGH (ref 0.61–1.24)
GFR calc Af Amer: >60 mL/min (ref >60)
GFR calc non Af Amer: 53 mL/min — ABNORMAL LOW (ref >60)
Glucose, Bld: 164 mg/dL — ABNORMAL HIGH (ref 65–99)
Potassium: 4.8 mmol/L (ref 3.5–5.1)
SODIUM: 138 mmol/L (ref 135–145)
TOTAL PROTEIN: 7.8 g/dL (ref 6.5–8.1)

## 2014-08-22 LAB — CBC WITH DIFFERENTIAL/PLATELET
BASOS ABS: 0 10*3/uL (ref 0.0–0.1)
Basophils Relative: 0 % (ref 0–1)
Eosinophils Absolute: 0.4 10*3/uL (ref 0.0–0.7)
Eosinophils Relative: 4 % (ref 0–5)
HCT: 38.8 % — ABNORMAL LOW (ref 39.0–52.0)
HEMOGLOBIN: 11.5 g/dL — AB (ref 13.0–17.0)
Lymphocytes Relative: 12 % (ref 12–46)
Lymphs Abs: 1.1 10*3/uL (ref 0.7–4.0)
MCH: 25.7 pg — AB (ref 26.0–34.0)
MCHC: 29.6 g/dL — ABNORMAL LOW (ref 30.0–36.0)
MCV: 86.8 fL (ref 78.0–100.0)
MONOS PCT: 10 % (ref 3–12)
Monocytes Absolute: 1 10*3/uL (ref 0.1–1.0)
Neutro Abs: 7 10*3/uL (ref 1.7–7.7)
Neutrophils Relative %: 74 % (ref 43–77)
Platelets: 345 10*3/uL (ref 150–400)
RBC: 4.47 MIL/uL (ref 4.22–5.81)
RDW: 18.1 % — ABNORMAL HIGH (ref 11.5–15.5)
WBC: 9.4 10*3/uL (ref 4.0–10.5)

## 2014-08-22 NOTE — Progress Notes (Signed)
Andrew Walls presented for labwork. Labs per MD order drawn via Peripheral Line 23 gauge needle inserted in left AC   Good blood return present. Procedure without incident.  Needle removed intact. Patient tolerated procedure well.

## 2014-08-22 NOTE — Assessment & Plan Note (Addendum)
Stage IV CRC after being initially diagnosed with earlier stage CRC in 2013 at [facility].  Now with positive liver biopsy for metastatic disease.  Began Xeloda 2500 mg in AM and PM 7 days on and 7 days off on 5/16.    Started 2nd half of cycle 1 on 08/21/2014.  Labs in 2 weeks: CBC diff, CMET, CEA  He will continue to hold Toprol with an improvement in Hypotension and side effects associated with that.  He is encouraged to follow-up with his cardiologist and primary care provider regarding the appropriateness of remaining off Toprol or a change in medication.  Reviewed his stage of disease and incurability.  Treatment is purely palliative.  He notes issues with pruritis associated with Xeloda.  Unfortunately, that is a side effect of treatment.  He has decided that despite this side effect he is willing to continue therapy.  I asked him at what point he would like to stop therapy.  He reports that if I ever have bad news related to his malignancy, he wants to know bluntly.  He will stop therapy if treatment fails he reports.  He is moving to [place], [place] and he was offered a transfer of care to a more local oncologist, but he declines.  Return in 14 days for follow-up and labs.

## 2014-08-22 NOTE — Progress Notes (Signed)
Andrew [address] Andrew Walls [place] [address]  Cancer of sigmoid colon, s/p colectomy [phone], cT4pN0 (5.5cm, 0/18 LN), K-ras (-) - Plan: Differential, Comprehensive metabolic panel, CEA  CURRENT THERAPY: Xeloda 2500 mg in AM and PM 7 days on and 7 days off.  INTERVAL HISTORY: Andrew Walls 53 y.o. 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     Cancer of sigmoid colon, s/p colectomy Jan2013, cT4pN0 (5.5cm, 0/18 LN), K-ras (-)   06/13/2014 Pathology Results Diagnosis Liver, needle/core biopsy, right lobe - METASTATIC ADENOCARCINOMA   08/06/2014 -  Chemotherapy Xeloda 2500 mg in AM and PM; 7 days on and 7 days off.    He reports that he started his next cycle on 08/21/2014.  He notes that since starting Xeloda yesterday he has noted minimal pruritis.  No dose modifications are necessary at this time.  He reports that he will be moving to [place], [place] and I have offered him a referral to oncology closer to home which is likely more practically and in the patient's best interest, but he was firm in his decision to remain with [place] at [facility].  I hope you don't have to see me every week.  I educated the patient about the incurability of his disease.  He and his brother are shocked by this information, but his niece reports that they knew this information already and Dr. Whitney Muse was very good at explaining this.  There was discussion regarding starting Avastin, but since he recently fell, we will hold off on this treatment option at this time.  He notes a fall off of the commode last week. He reports a persistent left sided headache.  Neurologically, he remains intact.  If worsens, he is to report to ED.  He is working with PT at home and he is noticing a benefit.  Oncologically, he denies any complaints and ROS questioning is negative.  Past Medical History  Diagnosis Date  . Diabetes mellitus 08/03/2011  . Morbid obesity with BMI of 40.0-44.9, adult 08/03/2011  .  Diverticulitis of colon 2006,2011,2012,2013    ?really perforated colon cancer  . Steatohepatitis   . Anxiety   . Depression   . Blood transfusion without reported diagnosis     age 64  . COPD (chronic obstructive pulmonary disease)   . Hypertension   . Recurrent boils   . Ulcerative colitis   . Coronary atherosclerosis     unspecified type of vessel, native or graft  . Cancer of sigmoid colon, s/p colectomy [id number], pT3pN0 (0/8 LN) 03/2011  . Cataract   . Seizures   . CVA (cerebral vascular accident)   . Metastatic adenocarcinoma to liver     has Diabetes mellitus; Cancer of sigmoid colon, s/p colectomy Jan2013, cT4pN0 (5.5cm, 0/18 LN), K-ras (-); Morbid obesity with BMI of 40.0-44.9, adult; Dizziness - light-headed; Diabetic neuropathy, type II diabetes mellitus; Depression; Postoperative intra-abdominal abscess; Steatohepatitis; Diarrhea; Abdominal pain, chronic, left lower quadrant; Anxiety; Muscle twitching - Panic attacks vs seizures; Abdominal pain, chronic, right lower quadrant; Back pain; Headache(784.0); Transient alteration of awareness; and Dizziness on his problem list.     is allergic to neosporin and sulfamethoxazole.  Current Outpatient Prescriptions on File Prior to Visit  Medication Sig Dispense Refill  . acetaminophen (TYLENOL) 500 MG tablet Take 500 mg by mouth every 4 (four) hours as needed for mild pain.     Marland Kitchen albuterol (VENTOLIN HFA) 108 (90 BASE) MCG/ACT inhaler  Inhale 2 puffs into the lungs every 6 (six) hours as needed for wheezing or shortness of breath.     Marland Kitchen aspirin EC 81 MG tablet Take 81 mg by mouth daily.    Marland Kitchen atorvastatin (LIPITOR) 10 MG tablet Take 10 mg by mouth at bedtime.     . baclofen (LIORESAL) 10 MG tablet Take 10 mg by mouth 3 (three) times daily.    . capecitabine (XELODA) 500 MG tablet Take 5 tablets (2,500 mg total) by mouth 2 (two) times daily after a meal. Take for 7 days on and 7 days off 140 tablet 6  . diphenhydrAMINE (BENADRYL) 25 MG  tablet Take 50 mg by mouth at bedtime as needed for itching, allergies or sleep.     . DULoxetine (CYMBALTA) 30 MG capsule Take 2 capsules (60 mg total) by mouth daily. 60 capsule 1  . gabapentin (NEURONTIN) 400 MG capsule Take 400 mg by mouth at bedtime.    . halobetasol (ULTRAVATE) 0.05 % cream Apply 1 application topically daily as needed (for irritation).    . Insulin Lispro, Human, (HUMALOG KWIKPEN [place]) Inject 5-12 Units into the skin 3 (three) times daily before meals. Sliding Scale 90-150 = 5 units  151-200= 6 units 201-250= 7 units 251-300= 8 units 301-350= 9 units 351-400= 10 units As directed per sliding scale instructions    . INVOKANA 100 MG TABS Take 100 mg by mouth daily.     . irbesartan (AVAPRO) 75 MG tablet Take 75 mg by mouth daily.    . LamoTRIgine 100 MG TB24 Take 1 tablet (100 mg total) by mouth daily. 30 tablet 1  . levothyroxine (SYNTHROID, LEVOTHROID) 25 MCG tablet Take 25 mcg by mouth daily before breakfast.    . Liraglutide (VICTOZA) 18 MG/3ML SOPN Inject 1.8 Units into the skin daily.     Marland Kitchen LITETOUCH PEN NEEDLES 31G X 8 MM MISC 4 (four) times daily.  3  . meclizine (ANTIVERT) 25 MG tablet Take 25 mg by mouth as needed for dizziness.    . mesalamine (LIALDA) 1.2 G EC tablet TAKE TWO (2) TABLETS BY MOUTH TWICE DAILY 120 tablet 6  . metoCLOPramide (REGLAN) 5 MG tablet     . metoprolol succinate (TOPROL-XL) 25 MG 24 hr tablet Take 25 mg by mouth daily.  3  . morphine (MS CONTIN) 30 MG 12 hr tablet Take 30 mg by mouth 2 (two) times daily.  0  . naloxegol oxalate (MOVANTIK) 25 MG TABS tablet Take 25 mg by mouth daily.    . nitroGLYCERIN (NITROSTAT) 0.4 MG SL tablet Place 0.4 mg under the tongue every 5 (five) minutes as needed for chest pain.    Marland Kitchen ondansetron (ZOFRAN) 8 MG tablet Take 1 tablet (8 mg total) by mouth every 8 (eight) hours as needed for nausea or vomiting. 45 tablet 1  . oxyCODONE (ROXICODONE) 15 MG immediate release tablet Take 15 mg by mouth 3 (three)  times daily as needed for pain.   0  . polyethylene glycol powder (GLYCOLAX/MIRALAX) powder Take 1 Container by mouth as needed. Pt takes 2 capsules for constipation    . PRODIGY TWIST TOP LANCETS 28G MISC daily.   12  . triamcinolone cream (KENALOG) 0.1 % Apply 1 application topically 2 (two) times daily.    Marland Kitchen LANTUS SOLOSTAR 100 UNIT/ML SOPN Inject 40 Units into the skin at bedtime.     . promethazine (PHENERGAN) 25 MG tablet Take 25 mg by mouth every 6 (six) hours as  needed for nausea or vomiting.     No current facility-administered medications on file prior to visit.    Past Surgical History  Procedure Laterality Date  . Abdominal surgery    . Colon surgery  04/23/2011    left colectomy  . Colonoscopy    . Polypectomy    . Fracture left leg    . Fracture left arm    . Tonsillectomy    . Cardiac catheterization      with stent placemnet    Denies any headaches, dizziness, double vision, fevers, chills, night sweats, nausea, vomiting, diarrhea, constipation, chest pain, heart palpitations, shortness of breath, blood in stool, black tarry stool, urinary pain, urinary burning, urinary frequency, hematuria.   PHYSICAL EXAMINATION  ECOG PERFORMANCE STATUS: 1 - Symptomatic but completely ambulatory  Filed Vitals:   08/22/14 1412  BP: 108/73  Pulse: 84  Temp: 97.7 F (36.5 C)  Resp: 16    GENERAL:alert, no distress, comfortable, cooperative, obese and smiling, accompanied by brother and niece. SKIN: skin color, texture, turgor are normal, positive for: dorsal aspect of hands and posterior forearm B/L that is erythematous and hyperkeratotic.   HEAD: Normocephalic, No masses, lesions, tenderness or abnormalities EYES: normal, PERRLA, EOMI, Conjunctiva are pink and non-injected EARS: External ears normal OROPHARYNX:lips, buccal mucosa, and tongue normal and mucous membranes are moist  NECK: supple, no adenopathy, trachea midline LYMPH:  no palpable lymphadenopathy BREAST:not  examined LUNGS: clear to auscultation  HEART: regular rate & rhythm ABDOMEN:abdomen soft, obese and normal bowel sounds BACK: Back symmetric, no curvature. EXTREMITIES:less then 2 second capillary refill, no joint deformities, effusion, or inflammation, no skin discoloration, no cyanosis  NEURO: alert & oriented x 3 with fluent speech, no focal motor/sensory deficits   LABORATORY DATA: CBC    Component Value Date/Time   WBC 9.6 08/06/2014 1600   RBC 4.57 08/06/2014 1600   HGB 11.3* 08/06/2014 1600   HCT 37.8* 08/06/2014 1600   PLT 438* 08/06/2014 1600   MCV 82.7 08/06/2014 1600   MCH 24.7* 08/06/2014 1600   MCHC 29.9* 08/06/2014 1600   RDW 15.7* 08/06/2014 1600   LYMPHSABS 1.0 08/06/2014 1600   MONOABS 0.5 08/06/2014 1600   EOSABS 0.2 08/06/2014 1600   BASOSABS 0.0 08/06/2014 1600      Chemistry      Component Value Date/Time   NA 136 08/06/2014 1600   K 4.5 08/06/2014 1600   CL 100* 08/06/2014 1600   CO2 23 08/06/2014 1600   BUN 18 08/06/2014 1600   CREATININE 1.30* 08/06/2014 1600   CREATININE 1.06 11/27/2011 1244      Component Value Date/Time   CALCIUM 9.3 08/06/2014 1600   ALKPHOS 322* 08/06/2014 1600   AST 56* 08/06/2014 1600   ALT 76* 08/06/2014 1600   BILITOT 1.8* 08/06/2014 1600     Lab Results  Component Value Date   CEA 206.8* 08/06/2014       ASSESSMENT AND PLAN:  Cancer of sigmoid colon, s/p colectomy Jan2013, cT4pN0 (5.5cm, 0/18 LN), K-ras (-) Stage IV CRC after being initially diagnosed with earlier stage CRC in 2013 at [facility].  Now with positive liver biopsy for metastatic disease.  Began Xeloda 2500 mg in AM and PM 7 days on and 7 days off on 5/16.    Started 2nd half of cycle 1 on 08/21/2014.  Labs in 2 weeks: CBC diff, CMET, CEA  He will continue to hold Toprol with an improvement in Hypotension and side effects associated with  that.  He is encouraged to follow-up with his cardiologist and primary care provider regarding the  appropriateness of remaining off Toprol or a change in medication.  Reviewed his stage of disease and incurability.  Treatment is purely palliative.  He notes issues with pruritis associated with Xeloda.  Unfortunately, that is a side effect of treatment.  He has decided that despite this side effect he is willing to continue therapy.  I asked him at what point he would like to stop therapy.  He reports that if I ever have bad news related to his malignancy, he wants to know bluntly.  He will stop therapy if treatment fails he reports.  He is moving to [place], [place] and he was offered a transfer of care to a more local oncologist, but he declines.  Return in 14 days for follow-up and labs.     THERAPY PLAN: He started the 2nd half of his cycle 1 yesterday, 5/31.  He would benefit most from Hospice/Palliative intervention.  All questions were answered. The patient knows to call the clinic with any problems, questions or concerns. We can certainly see the patient much sooner if necessary.  Patient and plan discussed with Dr. Ancil Linsey and she is in agreement with the aforementioned.   This note is electronically signed by: Doy Mince 08/22/2014 3:33 PM

## 2014-08-22 NOTE — Patient Instructions (Signed)
[place] at [facility] Discharge Instructions  RECOMMENDATIONS MADE BY THE CONSULTANT AND ANY TEST RESULTS WILL BE SENT TO YOUR REFERRING PHYSICIAN.  Exam and discussion by Robynn Pane, PA-C Continue to hold the Toprol.  Follow-up with your primary care and cardiology about this. Continue with the xeloda as prescribed.  If any issues with your labs we will call you. Call with uncontrolled nausea, vomiting or other concerns.  Follow-up in 2 weeks with labs and office visit.  Thank you for choosing [place] at [facility] to provide your oncology and hematology care.  To afford each patient quality time with our provider, please arrive at least 15 minutes before your scheduled appointment time.    You need to re-schedule your appointment should you arrive 10 or more minutes late.  We strive to give you quality time with our providers, and arriving late affects you and other patients whose appointments are after yours.  Also, if you no show three or more times for appointments you may be dismissed from the clinic at the providers discretion.     Again, thank you for choosing [facility].  Our hope is that these requests will decrease the amount of time that you wait before being seen by our physicians.       _____________________________________________________________  Should you have questions after your visit to [facility], please contact our office at [phone] between the hours of 8:30 a.m. and 4:30 p.m.  Voicemails left after 4:30 p.m. will not be returned until the following business day.  For prescription refill requests, have your pharmacy contact our office.

## 2014-08-24 ENCOUNTER — Other Ambulatory Visit (HOSPITAL_COMMUNITY): Payer: Self-pay | Admitting: Oncology

## 2014-08-24 ENCOUNTER — Other Ambulatory Visit: Payer: Self-pay | Admitting: Family Medicine

## 2014-08-24 MED ORDER — LAMOTRIGINE ER 100 MG PO TB24: 100.0000 mg | ORAL_TABLET | Freq: Every day | ORAL | Status: DC

## 2014-09-04 ENCOUNTER — Other Ambulatory Visit (HOSPITAL_COMMUNITY): Payer: Self-pay

## 2014-09-04 DIAGNOSIS — C187 Malignant neoplasm of sigmoid colon: Secondary | ICD-10-CM

## 2014-09-05 ENCOUNTER — Other Ambulatory Visit: Payer: Self-pay | Admitting: Family Medicine

## 2014-09-05 MED ORDER — LAMOTRIGINE ER 100 MG PO TB24: 100.0000 mg | ORAL_TABLET | Freq: Every day | ORAL | Status: DC

## 2014-09-07 ENCOUNTER — Ambulatory Visit (HOSPITAL_COMMUNITY): Payer: Medicare Other | Admitting: Hematology & Oncology

## 2014-09-07 ENCOUNTER — Other Ambulatory Visit (HOSPITAL_COMMUNITY): Payer: Medicare Other

## 2014-09-10 ENCOUNTER — Telehealth (HOSPITAL_COMMUNITY): Payer: Self-pay

## 2014-09-10 ENCOUNTER — Other Ambulatory Visit (HOSPITAL_COMMUNITY): Payer: Medicare Other

## 2014-09-10 ENCOUNTER — Ambulatory Visit (HOSPITAL_COMMUNITY): Payer: Medicare Other | Admitting: Oncology

## 2014-09-10 NOTE — Telephone Encounter (Signed)
Call from [name], wanted to let us know that Andrew Walls has been sick since yesterday with some nausea and vomiting and is too weak to come for his appointment today.  Rescheduled for 09/17/14.  States that he is taking his nausea medication and is keeping fluids down.

## 2014-09-12 ENCOUNTER — Encounter: Payer: Self-pay | Admitting: Neurology

## 2014-09-12 ENCOUNTER — Ambulatory Visit (INDEPENDENT_AMBULATORY_CARE_PROVIDER_SITE_OTHER): Payer: Medicare Other | Admitting: Neurology

## 2014-09-12 VITALS — BP 100/60 | HR 112 | Wt 276.6 lb

## 2014-09-12 DIAGNOSIS — C801 Malignant (primary) neoplasm, unspecified: Secondary | ICD-10-CM

## 2014-09-12 DIAGNOSIS — R51 Headache: Secondary | ICD-10-CM

## 2014-09-12 DIAGNOSIS — R519 Headache, unspecified: Secondary | ICD-10-CM

## 2014-09-12 DIAGNOSIS — G8929 Other chronic pain: Secondary | ICD-10-CM

## 2014-09-12 DIAGNOSIS — R42 Dizziness and giddiness: Secondary | ICD-10-CM | POA: Diagnosis not present

## 2014-09-12 DIAGNOSIS — F32A Depression, unspecified: Secondary | ICD-10-CM

## 2014-09-12 DIAGNOSIS — R404 Transient alteration of awareness: Secondary | ICD-10-CM | POA: Diagnosis not present

## 2014-09-12 DIAGNOSIS — C787 Secondary malignant neoplasm of liver and intrahepatic bile duct: Secondary | ICD-10-CM | POA: Insufficient documentation

## 2014-09-12 DIAGNOSIS — G4700 Insomnia, unspecified: Secondary | ICD-10-CM | POA: Insufficient documentation

## 2014-09-12 DIAGNOSIS — R1013 Epigastric pain: Secondary | ICD-10-CM

## 2014-09-12 DIAGNOSIS — F329 Major depressive disorder, single episode, unspecified: Secondary | ICD-10-CM

## 2014-09-12 MED ORDER — DULOXETINE HCL 30 MG PO CPEP: ORAL_CAPSULE | ORAL | Status: AC

## 2014-09-12 MED ORDER — LAMOTRIGINE ER 100 MG PO TB24: 100.0000 mg | ORAL_TABLET | Freq: Every day | ORAL | Status: AC

## 2014-09-12 NOTE — Patient Instructions (Signed)
1. Continue Lamotrigine 100mg  daily 2. Increase Cymbalta 30mg : Take 2 caps in AM, 1 cap in PM 3. Discuss sleep issues with your PCP 4. Continue follow-up with Pain Specialist 5. I wish you well with the cancer treatments 6. Follow-up in 4-5 months
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
## 2014-09-17 ENCOUNTER — Encounter (HOSPITAL_BASED_OUTPATIENT_CLINIC_OR_DEPARTMENT_OTHER): Payer: Medicare Other | Admitting: Oncology

## 2014-09-17 ENCOUNTER — Encounter (HOSPITAL_BASED_OUTPATIENT_CLINIC_OR_DEPARTMENT_OTHER): Payer: Medicare Other

## 2014-09-17 ENCOUNTER — Encounter (HOSPITAL_COMMUNITY): Payer: Self-pay | Admitting: Oncology

## 2014-09-17 ENCOUNTER — Other Ambulatory Visit (HOSPITAL_COMMUNITY): Payer: Medicare Other

## 2014-09-17 VITALS — BP 101/63 | HR 72 | Temp 97.4°F | Resp 16 | Wt 278.0 lb

## 2014-09-17 DIAGNOSIS — C787 Secondary malignant neoplasm of liver and intrahepatic bile duct: Secondary | ICD-10-CM | POA: Diagnosis not present

## 2014-09-17 DIAGNOSIS — C187 Malignant neoplasm of sigmoid colon: Secondary | ICD-10-CM | POA: Diagnosis present

## 2014-09-17 LAB — COMPREHENSIVE METABOLIC PANEL
ALK PHOS: 195 U/L — AB (ref 38–126)
ALT: 31 U/L (ref 17–63)
AST: 31 U/L (ref 15–41)
Albumin: 3.3 g/dL — ABNORMAL LOW (ref 3.5–5.0)
Anion gap: 6 (ref 5–15)
BILIRUBIN TOTAL: 1.2 mg/dL (ref 0.3–1.2)
BUN: 16 mg/dL (ref 6–20)
CO2: 28 mmol/L (ref 22–32)
Calcium: 8.9 mg/dL (ref 8.9–10.3)
Chloride: 106 mmol/L (ref 101–111)
Creatinine, Ser: 1.13 mg/dL (ref 0.61–1.24)
GFR calc non Af Amer: >60 mL/min (ref >60)
GLUCOSE: 169 mg/dL — AB (ref 65–99)
Potassium: 4.2 mmol/L (ref 3.5–5.1)
SODIUM: 140 mmol/L (ref 135–145)
Total Protein: 6.8 g/dL (ref 6.5–8.1)

## 2014-09-17 LAB — CBC WITH DIFFERENTIAL/PLATELET
BASOS ABS: 0 10*3/uL (ref 0.0–0.1)
Basophils Relative: 0 % (ref 0–1)
EOS ABS: 0.4 10*3/uL (ref 0.0–0.7)
Eosinophils Relative: 6 % — ABNORMAL HIGH (ref 0–5)
HCT: 34.8 % — ABNORMAL LOW (ref 39.0–52.0)
Hemoglobin: 10.6 g/dL — ABNORMAL LOW (ref 13.0–17.0)
Lymphocytes Relative: 18 % (ref 12–46)
Lymphs Abs: 1.3 10*3/uL (ref 0.7–4.0)
MCH: 26.3 pg (ref 26.0–34.0)
MCHC: 30.5 g/dL (ref 30.0–36.0)
MCV: 86.4 fL (ref 78.0–100.0)
MONO ABS: 0.8 10*3/uL (ref 0.1–1.0)
Monocytes Relative: 12 % (ref 3–12)
Neutro Abs: 4.5 10*3/uL (ref 1.7–7.7)
Neutrophils Relative %: 64 % (ref 43–77)
PLATELETS: 262 10*3/uL (ref 150–400)
RBC: 4.03 MIL/uL — AB (ref 4.22–5.81)
RDW: 19.9 % — AB (ref 11.5–15.5)
WBC: 7 10*3/uL (ref 4.0–10.5)

## 2014-09-17 NOTE — Patient Instructions (Signed)
[place] at [facility] Discharge Instructions  RECOMMENDATIONS MADE BY THE CONSULTANT AND ANY TEST RESULTS WILL BE SENT TO YOUR REFERRING PHYSICIAN.  Exam and discussion today with Kirby Crigler, PA. Continue Xeloda as prescribed. Office visit and lab work in 3 weeks.  Thank you for choosing [place] at [facility] to provide your oncology and hematology care.  To afford each patient quality time with our provider, please arrive at least 15 minutes before your scheduled appointment time.    You need to re-schedule your appointment should you arrive 10 or more minutes late.  We strive to give you quality time with our providers, and arriving late affects you and other patients whose appointments are after yours.  Also, if you no show three or more times for appointments you may be dismissed from the clinic at the providers discretion.     Again, thank you for choosing [facility].  Our hope is that these requests will decrease the amount of time that you wait before being seen by our physicians.       _____________________________________________________________  Should you have questions after your visit to [facility], please contact our office at [phone] between the hours of 8:30 a.m. and 4:30 p.m.  Voicemails left after 4:30 p.m. will not be returned until the following business day.  For prescription refill requests, have your pharmacy contact our office.

## 2014-09-17 NOTE — Assessment & Plan Note (Signed)
Stage IV CRC after being initially diagnosed with earlier stage CRC in 2013 at [facility].  Now with positive liver biopsy for metastatic disease.  Continue Xeloda 2500 mg in AM and PM 7 days on and 7 days off.    Labs in 3 weeks: CBC diff, CMET, CEA  Treatment is purely palliative.  He notes issues with pruritis which he thought was associated with Xeloda, but he notes that it has gone since he stopped taking Oxycodone at bed time. I informed him that if his CEA does not show signs of a decline, then we will need to discuss what his desires are.   He moved to [place], [place] and he continues to decline transfer of care to a more local oncologist.  Return in 3 weeks for follow-up and labs.

## 2014-09-17 NOTE — Progress Notes (Signed)
Glenda Chroman., MD [place] [id number]  Cancer of sigmoid colon, s/p colectomy [id number], cT4pN0 (5.5cm, 0/18 LN), K-ras (-)  CURRENT THERAPY: Xeloda 2500 mg in AM and PM 7 days on and 7 days off.  Currently on week off, about to begin tomorrow, 09/18/14.  INTERVAL HISTORY: CHANCELER PULLIN 54 y.o. 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     Cancer of sigmoid colon, s/p colectomy Jan2013, cT4pN0 (5.5cm, 0/18 LN), K-ras (-)   06/13/2014 Pathology Results Diagnosis Liver, needle/core biopsy, right lobe - METASTATIC ADENOCARCINOMA   08/06/2014 -  Chemotherapy Xeloda 2500 mg in AM and PM; 7 days on and 7 days off.    I personally reviewed and went over laboratory results with the patient.  The results are noted within this dictation.  He is tolerating therapy well.  He has a few skin lesions on his face and left side of face that recently appeared.  They have an element of self-infliction.  He reports that he had a lesion resected from his leg by a physician and he is waiting to hear pathology from that.  He has an appointment next week.    He has moved to [place], [place] and he is not interested at this time to tranfer his oncology care closer to home.  Past Medical History  Diagnosis Date  . Diabetes mellitus 08/03/2011  . Morbid obesity with BMI of 40.0-44.9, adult 08/03/2011  . Diverticulitis of colon 2006,2011,2012,2013    ?really perforated colon cancer  . Steatohepatitis   . Anxiety   . Depression   . Blood transfusion without reported diagnosis     age 54  . COPD (chronic obstructive pulmonary disease)   . Hypertension   . Recurrent boils   . Ulcerative colitis   . Coronary atherosclerosis     unspecified type of vessel, native or graft  . Cancer of sigmoid colon, s/p colectomy [id number], pT3pN0 (0/8 LN) 03/2011  . Cataract   . Seizures   . CVA (cerebral vascular accident)   . Metastatic adenocarcinoma to liver     has Diabetes mellitus;  Cancer of sigmoid colon, s/p colectomy Jan2013, cT4pN0 (5.5cm, 0/18 LN), K-ras (-); Morbid obesity with BMI of 40.0-44.9, adult; Dizziness - light-headed; Diabetic neuropathy, type II diabetes mellitus; Depression; Postoperative intra-abdominal abscess; Steatohepatitis; Diarrhea; Abdominal pain, chronic, left lower quadrant; Anxiety; Muscle twitching - Panic attacks vs seizures; Abdominal pain, chronic, right lower quadrant; Back pain; Headache(784.0); Transient alteration of awareness; Dizziness; Worsening headaches; Insomnia; and Liver metastases on his problem list.     is allergic to neosporin; oxycodone; and sulfamethoxazole.  Current Outpatient Prescriptions on File Prior to Visit  Medication Sig Dispense Refill  . acetaminophen (TYLENOL) 500 MG tablet Take 500 mg by mouth every 4 (four) hours as needed for mild pain.     Marland Kitchen albuterol (VENTOLIN HFA) 108 (90 BASE) MCG/ACT inhaler Inhale 2 puffs into the lungs every 6 (six) hours as needed for wheezing or shortness of breath.     Marland Kitchen aspirin EC 81 MG tablet Take 81 mg by mouth daily.    Marland Kitchen atorvastatin (LIPITOR) 10 MG tablet Take 10 mg by mouth at bedtime.     . bacitracin ointment Apply topically 2 (two) times daily. to affected area  1  . baclofen (LIORESAL) 10 MG tablet Take 10 mg by mouth 3 (three) times daily.    . capecitabine (XELODA) 500 MG tablet Take 5  tablets (2,500 mg total) by mouth 2 (two) times daily after a meal. Take for 7 days on and 7 days off 140 tablet 6  . diphenhydrAMINE (BENADRYL) 25 MG tablet Take 50 mg by mouth at bedtime as needed for itching, allergies or sleep.     . DULoxetine (CYMBALTA) 30 MG capsule Take 2 caps in AM, 1 cap in PM 90 capsule 6  . gabapentin (NEURONTIN) 400 MG capsule Take 400 mg by mouth 3 (three) times daily.     . halobetasol (ULTRAVATE) 0.05 % cream Apply 1 application topically daily as needed (for irritation).    . Insulin Lispro, Human, (HUMALOG KWIKPEN Trion) Inject 5-12 Units into the skin 3  (three) times daily before meals. Sliding Scale 90-150 = 5 units  151-200= 6 units 201-250= 7 units 251-300= 8 units 301-350= 9 units 351-400= 10 units As directed per sliding scale instructions    . INVOKANA 100 MG TABS Take 100 mg by mouth daily.     . irbesartan (AVAPRO) 75 MG tablet Take 75 mg by mouth daily.    . LamoTRIgine 100 MG TB24 Take 1 tablet (100 mg total) by mouth daily. 30 tablet 11  . LANTUS SOLOSTAR 100 UNIT/ML SOPN Inject 40 Units into the skin at bedtime.     . levothyroxine (SYNTHROID, LEVOTHROID) 25 MCG tablet Take 25 mcg by mouth daily before breakfast.    . LITETOUCH PEN NEEDLES 31G X 8 MM MISC 4 (four) times daily.  3  . meclizine (ANTIVERT) 25 MG tablet Take 25 mg by mouth as needed for dizziness.    . mesalamine (LIALDA) 1.2 G EC tablet TAKE TWO (2) TABLETS BY MOUTH TWICE DAILY 120 tablet 6  . metoCLOPramide (REGLAN) 5 MG tablet     . metoprolol succinate (TOPROL-XL) 25 MG 24 hr tablet Take 25 mg by mouth daily.  3  . morphine (MS CONTIN) 30 MG 12 hr tablet Take 30 mg by mouth 2 (two) times daily.  0  . naloxegol oxalate (MOVANTIK) 25 MG TABS tablet Take 25 mg by mouth daily.    . nitroGLYCERIN (NITROSTAT) 0.4 MG SL tablet Place 0.4 mg under the tongue every 5 (five) minutes as needed for chest pain.    . ondansetron (ZOFRAN) 8 MG tablet TAKE ONE TABLET BY MOUTH EVERY 8 HOURS AS NEEDED FOR NAUSEA AND VOMITING 45 tablet 2  . oxyCODONE (ROXICODONE) 15 MG immediate release tablet Take 15 mg by mouth 3 (three) times daily as needed for pain.   0  . polyethylene glycol powder (GLYCOLAX/MIRALAX) powder Take 1 Container by mouth as needed. Pt takes 2 capsules for constipation    . PRODIGY TWIST TOP LANCETS 28G MISC daily.   12  . promethazine (PHENERGAN) 25 MG tablet Take 25 mg by mouth every 6 (six) hours as needed for nausea or vomiting.    . triamcinolone cream (KENALOG) 0.1 % Apply 1 application topically 2 (two) times daily.    . TRUETRACK TEST test strip USE ONE  STRIP TO TEST BLOOD SUGAR FOUR TIMES DAILY.  11   No current facility-administered medications on file prior to visit.    Past Surgical History  Procedure Laterality Date  . Abdominal surgery    . Colon surgery  04/23/2011    left colectomy  . Colonoscopy    . Polypectomy    . Fracture left leg    . Fracture left arm    . Tonsillectomy    . Cardiac catheterization        with stent placemnet    Denies any headaches, dizziness, double vision, fevers, chills, night sweats, nausea, vomiting, diarrhea, constipation, chest pain, heart palpitations, shortness of breath, blood in stool, black tarry stool, urinary pain, urinary burning, urinary frequency, hematuria.   PHYSICAL EXAMINATION  ECOG PERFORMANCE STATUS: 1 - Symptomatic but completely ambulatory  Filed Vitals:   09/17/14 1320  BP: 101/63  Pulse: 72  Temp: 97.4 F (36.3 C)  Resp: 16    GENERAL:alert, no distress, comfortable, cooperative, obese and smiling, accompanied by brother and niece. SKIN: skin color, texture, turgor are normal, positive for: dorsal aspect of hands and posterior forearm B/L that is erythematous and hyperkeratotic.  New skin lesions on left side of face and near the marionette line of face bilaterally. HEAD: Normocephalic, No masses, lesions, tenderness or abnormalities EYES: normal, PERRLA, EOMI, Conjunctiva are pink and non-injected EARS: External ears normal OROPHARYNX:lips, buccal mucosa, and tongue normal and mucous membranes are moist  NECK: supple, no adenopathy, trachea midline LYMPH:  no palpable lymphadenopathy BREAST:not examined LUNGS: clear to auscultation  HEART: regular rate & rhythm ABDOMEN:abdomen soft, obese and normal bowel sounds BACK: Back symmetric, no curvature. EXTREMITIES:less then 2 second capillary refill, no joint deformities, effusion, or inflammation, no skin discoloration, no cyanosis  NEURO: alert & oriented x 3 with fluent speech, no focal motor/sensory  deficits   LABORATORY DATA: CBC    Component Value Date/Time   WBC 7.0 09/17/2014 1249   RBC 4.03* 09/17/2014 1249   HGB 10.6* 09/17/2014 1249   HCT 34.8* 09/17/2014 1249   PLT 262 09/17/2014 1249   MCV 86.4 09/17/2014 1249   MCH 26.3 09/17/2014 1249   MCHC 30.5 09/17/2014 1249   RDW 19.9* 09/17/2014 1249   LYMPHSABS 1.3 09/17/2014 1249   MONOABS 0.8 09/17/2014 1249   EOSABS 0.4 09/17/2014 1249   BASOSABS 0.0 09/17/2014 1249      Chemistry      Component Value Date/Time   NA 140 09/17/2014 1249   K 4.2 09/17/2014 1249   CL 106 09/17/2014 1249   CO2 28 09/17/2014 1249   BUN 16 09/17/2014 1249   CREATININE 1.13 09/17/2014 1249   CREATININE 1.06 11/27/2011 1244      Component Value Date/Time   CALCIUM 8.9 09/17/2014 1249   ALKPHOS 195* 09/17/2014 1249   AST 31 09/17/2014 1249   ALT 31 09/17/2014 1249   BILITOT 1.2 09/17/2014 1249     Lab Results  Component Value Date   CEA 206.8* 08/06/2014       ASSESSMENT AND PLAN:  Cancer of sigmoid colon, s/p colectomy Jan2013, cT4pN0 (5.5cm, 0/18 LN), K-ras (-) Stage IV CRC after being initially diagnosed with earlier stage CRC in 2013 at [facility].  Now with positive liver biopsy for metastatic disease.  Continue Xeloda 2500 mg in AM and PM 7 days on and 7 days off.    Labs in 3 weeks: CBC diff, CMET, CEA  Treatment is purely palliative.  He notes issues with pruritis which he thought was associated with Xeloda, but he notes that it has gone since he stopped taking Oxycodone at bed time. I informed him that if his CEA does not show signs of a decline, then we will need to discuss what his desires are.   He moved to [place], [place] and he continues to decline transfer of care to a more local oncologist.  Return in 3 weeks for follow-up and labs.    THERAPY PLAN: He would benefit most from   Hospice/Palliative intervention, but he wishes to continue treatment at this time.  All questions were answered. The  patient knows to call the clinic with any problems, questions or concerns. We can certainly see the patient much sooner if necessary.  Patient and plan discussed with Dr. Shannon Penland and she is in agreement with the aforementioned.   This note is electronically signed by: KEFALAS,THOMAS, PA-C 09/17/2014 2:18 PM 

## 2014-09-17 NOTE — Progress Notes (Signed)
LABS DRAWN

## 2014-09-18 LAB — CEA: CEA: 133.3 ng/mL — ABNORMAL HIGH (ref 0.0–4.7)

## 2014-09-25 ENCOUNTER — Telehealth: Payer: Self-pay | Admitting: Family Medicine

## 2014-09-25 NOTE — Telephone Encounter (Signed)
Received faxed from [facility] that Cymbalta 30 mg caps were approved # [id number] for a 30 day supply.   I did call the patient's pharmacy to make them aware.

## 2014-10-08 ENCOUNTER — Other Ambulatory Visit (HOSPITAL_COMMUNITY): Payer: Self-pay

## 2014-10-08 DIAGNOSIS — C187 Malignant neoplasm of sigmoid colon: Secondary | ICD-10-CM

## 2014-10-09 ENCOUNTER — Other Ambulatory Visit (HOSPITAL_COMMUNITY): Payer: Medicare Other

## 2014-10-09 ENCOUNTER — Ambulatory Visit (HOSPITAL_COMMUNITY): Payer: Medicare Other | Admitting: Hematology & Oncology

## 2014-10-10 ENCOUNTER — Encounter (HOSPITAL_COMMUNITY): Payer: Medicare Other | Attending: Hematology & Oncology | Admitting: Hematology & Oncology

## 2014-10-10 ENCOUNTER — Encounter (HOSPITAL_BASED_OUTPATIENT_CLINIC_OR_DEPARTMENT_OTHER): Payer: Medicare Other

## 2014-10-10 ENCOUNTER — Encounter (HOSPITAL_COMMUNITY): Payer: Self-pay | Admitting: *Deleted

## 2014-10-10 ENCOUNTER — Encounter (HOSPITAL_COMMUNITY): Payer: Self-pay | Admitting: Hematology & Oncology

## 2014-10-10 VITALS — BP 134/69 | HR 88 | Temp 98.1°F | Resp 18 | Wt 272.0 lb

## 2014-10-10 DIAGNOSIS — K5190 Ulcerative colitis, unspecified, without complications: Secondary | ICD-10-CM | POA: Insufficient documentation

## 2014-10-10 DIAGNOSIS — C787 Secondary malignant neoplasm of liver and intrahepatic bile duct: Secondary | ICD-10-CM | POA: Diagnosis not present

## 2014-10-10 DIAGNOSIS — C187 Malignant neoplasm of sigmoid colon: Secondary | ICD-10-CM | POA: Diagnosis present

## 2014-10-10 DIAGNOSIS — R569 Unspecified convulsions: Secondary | ICD-10-CM | POA: Diagnosis not present

## 2014-10-10 DIAGNOSIS — C189 Malignant neoplasm of colon, unspecified: Secondary | ICD-10-CM

## 2014-10-10 DIAGNOSIS — C772 Secondary and unspecified malignant neoplasm of intra-abdominal lymph nodes: Secondary | ICD-10-CM

## 2014-10-10 LAB — COMPREHENSIVE METABOLIC PANEL
ALK PHOS: 272 U/L — AB (ref 38–126)
ALT: 45 U/L (ref 17–63)
AST: 44 U/L — AB (ref 15–41)
Albumin: 3.3 g/dL — ABNORMAL LOW (ref 3.5–5.0)
Anion gap: 9 (ref 5–15)
BUN: 15 mg/dL (ref 6–20)
CALCIUM: 8.6 mg/dL — AB (ref 8.9–10.3)
CHLORIDE: 101 mmol/L (ref 101–111)
CO2: 23 mmol/L (ref 22–32)
Creatinine, Ser: 1.14 mg/dL (ref 0.61–1.24)
GFR calc Af Amer: >60 mL/min (ref >60)
GFR calc non Af Amer: >60 mL/min (ref >60)
Glucose, Bld: 334 mg/dL — ABNORMAL HIGH (ref 65–99)
POTASSIUM: 4.5 mmol/L (ref 3.5–5.1)
Sodium: 133 mmol/L — ABNORMAL LOW (ref 135–145)
Total Bilirubin: 0.8 mg/dL (ref 0.3–1.2)
Total Protein: 6.8 g/dL (ref 6.5–8.1)

## 2014-10-10 LAB — CBC WITH DIFFERENTIAL/PLATELET
BASOS ABS: 0 10*3/uL (ref 0.0–0.1)
Basophils Relative: 0 % (ref 0–1)
EOS PCT: 7 % — AB (ref 0–5)
Eosinophils Absolute: 0.4 10*3/uL (ref 0.0–0.7)
HEMATOCRIT: 32.6 % — AB (ref 39.0–52.0)
HEMOGLOBIN: 10.1 g/dL — AB (ref 13.0–17.0)
Lymphocytes Relative: 16 % (ref 12–46)
Lymphs Abs: 0.8 10*3/uL (ref 0.7–4.0)
MCH: 24.9 pg — AB (ref 26.0–34.0)
MCHC: 31 g/dL (ref 30.0–36.0)
MCV: 80.5 fL (ref 78.0–100.0)
Monocytes Absolute: 0.5 10*3/uL (ref 0.1–1.0)
Monocytes Relative: 10 % (ref 3–12)
Neutro Abs: 3.5 10*3/uL (ref 1.7–7.7)
Neutrophils Relative %: 67 % (ref 43–77)
Platelets: 257 10*3/uL (ref 150–400)
RBC: 4.05 MIL/uL — AB (ref 4.22–5.81)
RDW: 18.3 % — AB (ref 11.5–15.5)
WBC: 5.3 10*3/uL (ref 4.0–10.5)

## 2014-10-10 NOTE — Patient Instructions (Signed)
[place] at [facility] Discharge Instructions  RECOMMENDATIONS MADE BY THE CONSULTANT AND ANY TEST RESULTS WILL BE SENT TO YOUR REFERRING PHYSICIAN.  Exam completed by Dr Whitney Muse today Referral made to hospice Please call the clinic if you have any questions or concerns  Thank you for choosing [place] at [facility] to provide your oncology and hematology care.  To afford each patient quality time with our provider, please arrive at least 15 minutes before your scheduled appointment time.    You need to re-schedule your appointment should you arrive 10 or more minutes late.  We strive to give you quality time with our providers, and arriving late affects you and other patients whose appointments are after yours.  Also, if you no show three or more times for appointments you may be dismissed from the clinic at the providers discretion.     Again, thank you for choosing [facility].  Our hope is that these requests will decrease the amount of time that you wait before being seen by our physicians.       _____________________________________________________________  Should you have questions after your visit to [facility], please contact our office at [phone] between the hours of 8:30 a.m. and 4:30 p.m.  Voicemails left after 4:30 p.m. will not be returned until the following business day.  For prescription refill requests, have your pharmacy contact our office.

## 2014-10-10 NOTE — Progress Notes (Signed)
LABS DRAWN

## 2014-10-10 NOTE — Progress Notes (Signed)
VYAS,DHRUV B., MD  DIAGNOSIS: CRC with history of sigmoid resection 03/2011 Ulcerative colitis diagnosed April 2014 CT A/P on 04/11/2014 with new liver metastases, new LAD in the portacaval space and retroperitoneum MRI of the head with and without contrast at [facility] on 05/03/2014 with no acute intracranial abnormality, chronic left parietal infarct CAD, fixed defect involving the inferior wall as well as the apex at the septum Multiple syncopal events Ultrasound guided liver biopsy on 06/13/2014     Cancer of sigmoid colon, s/p colectomy [id number], cT4pN0 (5.5cm, 0/18 LN), K-ras (-)   06/13/2014 Pathology Results Diagnosis Liver, needle/core biopsy, right lobe - METASTATIC ADENOCARCINOMA   08/06/2014 -  Chemotherapy Xeloda 2500 mg in AM and PM; 7 days on and 7 days off.    CURRENT THERAPY: Xeloda  INTERVAL HISTORY: Andrew Walls 54 y.o. male returns for follow up.   He continues to struggle with depression. He continues to have chronic back pain. He complains of nausea that has also been chronic.  He is here today with family. He did not take his chemo treatment last week. He wants to discontinue chemotherapy. He says "I'm just tired." He has stopped taking his other medications as well. He is eating better and feeling better overall since stopping all of his medicine. He has been craving apples and ice. Andrew Walls' only concern is to not be in pain anymore. He says all he does is sit around the house and cry. He "is tired and does not want to live anymore." He does not think the medicine is doing what it is supposed to do. Explaining that when he stopped taking the medicine he felt better. The morphine no longer helps but he does not want to become addicted to it by increasing it.  He did not feel as though the anti-depressant helped at all. He has not slept in 2 days. He fell asleep at 4am this morning, and woke up 2 hours later to the alarm for a doctor's appointment  in [place] with Dr. Nils Pyle in [place]. When he tries to sleep, he rolls and turns, watches tv, or goes outside when he can't fall asleep. After discontinuing his medication, the only symptom he still has is that he still stumbles while walking though he has not fallen down. He stopped taking his insulin and has not checked his sugar in about 2 weeks.  His friend was curious if Hospice is still only offered to those within 6 months of death. His friend mentioned that from the scans recently done, the doctor at the other hospital said there were more nodules in his liver.  He is agreeable to speaking with Hospice at his home in [place] to learn more about their services.   MEDICAL HISTORY: Past Medical History  Diagnosis Date  . Diabetes mellitus 08/03/2011  . Morbid obesity with BMI of 40.0-44.9, adult 08/03/2011  . Diverticulitis of colon 2006,2011,2012,2013    ?really perforated colon cancer  . Steatohepatitis   . Anxiety   . Depression   . Blood transfusion without reported diagnosis     age 75  . COPD (chronic obstructive pulmonary disease)   . Hypertension   . Recurrent boils   . Ulcerative colitis   . Coronary atherosclerosis     unspecified type of vessel, native or graft  . Cancer of sigmoid colon, s/p colectomy [id number], pT3pN0 (0/8 LN) 03/2011  . Cataract   . Seizures   . CVA (cerebral vascular  accident)   . Metastatic adenocarcinoma to liver     has Diabetes mellitus; Cancer of sigmoid colon, s/p colectomy Jan2013, cT4pN0 (5.5cm, 0/18 LN), K-ras (-); Morbid obesity with BMI of 40.0-44.9, adult; Dizziness - light-headed; Diabetic neuropathy, type II diabetes mellitus; Depression; Postoperative intra-abdominal abscess; Steatohepatitis; Diarrhea; Abdominal pain, chronic, left lower quadrant; Anxiety; Muscle twitching - Panic attacks vs seizures; Abdominal pain, chronic, right lower quadrant; Back pain; Headache(784.0); Transient alteration of awareness; Dizziness;  Worsening headaches; Insomnia; and Liver metastases on his problem list.     is allergic to neosporin; oxycodone; and sulfamethoxazole.    SURGICAL HISTORY: Past Surgical History  Procedure Laterality Date  . Abdominal surgery    . Colon surgery  04/23/2011    left colectomy  . Colonoscopy    . Polypectomy    . Fracture left leg    . Fracture left arm    . Tonsillectomy    . Cardiac catheterization      with stent placemnet    SOCIAL HISTORY: Social History   Social History  . Marital Status: Single    Spouse Name: N/A  . Number of Children: N/A  . Years of Education: N/A   Occupational History  . Not on file.   Social History Main Topics  . Smoking status: Never Smoker   . Smokeless tobacco: Never Used  . Alcohol Use: No  . Drug Use: No  . Sexual Activity: Not on file   Other Topics Concern  . Not on file   Social History Narrative    FAMILY HISTORY: Family History  Problem Relation Age of Onset  . Heart disease Mother   . Cancer Father     melanoma  . Heart disease Maternal Grandmother   . Colon cancer Neg Hx   . Rectal cancer Neg Hx   . Stomach cancer Neg Hx     Review of Systems  Constitutional: Positive for malaise/fatigue.  HENT: Negative.   Eyes: Negative.   Respiratory:  Negative. Cardiovascular: Negative.   Gastrointestinal: Negative. Genitourinary:  Negative. Musculoskeletal: Positive for joint pain.  Skin: Negative.   Neurological: Negative.   Endo/Heme/Allergies: Negative.   Psychiatric/Behavioral: Positive for depression. The patient is nervous/anxious and has insomnia.   14 point review of systems was performed and is negative except as detailed under history of present illness and above   PHYSICAL EXAMINATION  ECOG PERFORMANCE STATUS: 1 - Symptomatic but completely ambulatory  Filed Vitals:   10/10/14 1400  BP: 134/69  Pulse: 88  Temp: 98.1 F (36.7 C)  Resp: 18    Physical Exam  Constitutional: He is oriented to  person, place, and time and well-developed, well-nourished, and in no distress.      Obese, flat affect  HENT:  Head: Normocephalic and atraumatic.  Nose: Nose normal.  Mouth/Throat: Oropharynx is clear and moist. No oropharyngeal exudate.  Eyes: Conjunctivae and EOM are normal. Pupils are equal, round, and reactive to light. Right eye exhibits no discharge. Left eye exhibits no discharge. No scleral icterus.  Neck: Normal range of motion. Neck supple. No tracheal deviation present. No thyromegaly present.  Cardiovascular: Normal rate, regular rhythm and normal heart sounds.  Exam reveals no gallop and no friction rub.   No murmur heard. Pulmonary/Chest: Effort normal and breath sounds normal. He has no wheezes. He has no rales.  Abdominal: Soft. Bowel sounds are normal. He exhibits no distension and no mass. There is no tenderness. There is no rebound and no guarding.  Obese  Musculoskeletal: Normal range of motion. He exhibits no edema.  Lymphadenopathy:    He has no cervical adenopathy.  Neurological: He is alert and oriented to person, place, and time. He has normal reflexes. No cranial nerve deficit. Gait normal. Coordination normal.  Skin: Skin is warm and dry. Erythematous scaling rash on the posterior aspect of both hands (patient states he is allergic to the sun) Psychiatric: Memory normal.  Nursing note and vitals reviewed.   LABORATORY DATA:  CBC    Component Value Date/Time   WBC 5.3 10/10/2014 1325   RBC 4.05* 10/10/2014 1325   HGB 10.1* 10/10/2014 1325   HCT 32.6* 10/10/2014 1325   PLT 257 10/10/2014 1325   MCV 80.5 10/10/2014 1325   MCH 24.9* 10/10/2014 1325   MCHC 31.0 10/10/2014 1325   RDW 18.3* 10/10/2014 1325   LYMPHSABS 0.8 10/10/2014 1325   MONOABS 0.5 10/10/2014 1325   EOSABS 0.4 10/10/2014 1325   BASOSABS 0.0 10/10/2014 1325   CMP     Component Value Date/Time   NA 133* 10/10/2014 1325   K 4.5 10/10/2014 1325   CL 101 10/10/2014 1325   CO2 23  10/10/2014 1325   GLUCOSE 334* 10/10/2014 1325   BUN 15 10/10/2014 1325   CREATININE 1.14 10/10/2014 1325   CREATININE 1.06 11/27/2011 1244   CALCIUM 8.6* 10/10/2014 1325   PROT 6.8 10/10/2014 1325   ALBUMIN 3.3* 10/10/2014 1325   AST 44* 10/10/2014 1325   ALT 45 10/10/2014 1325   ALKPHOS 272* 10/10/2014 1325   BILITOT 0.8 10/10/2014 1325   GFRNONAA >60 10/10/2014 1325   GFRAA >60 10/10/2014 1325     PATHOLOGY: Diagnosis Liver, needle/core biopsy, right lobe - METASTATIC ADENOCARCINOMA, SEE COMMENT.    ASSESSMENT and THERAPY PLAN:   Stage IV colon cancer Ulcerative colitis Liver biopsy with biopsy proven metastatic disease Chronic depression, has been treated by [facility] behavioral health  I discussed with the patient and his family stopping all of his medications in my concerns about his insulin. He states that he understands he has stage IV cancer. He notes he has had chronic pain for many years. He feels that he has made a sound decision in regards to stopping chemotherapy and other medications that "make him feel bad." I have offered him a referral to psychiatry on several different occasions but he declines.  He would like a hospice referral. He has moved [place].  He and his family have questions about hospice choices. I discussed with them some of the choices I am aware of and we will also look into this further for them. He states his primary goal is to be able to be at home, avoid additional physician visits, and be comfortable.   I have discussed Hospice with the patient at length upon hearing that he would like to discontinue chemotherapy. We talked about increased quality of life in the home, while still administering palliative care and pain management.  He is agreeable to speaking with Hospice at his home in [place] to learn more about their services. Call [facility] (number on file) to schedule a time.  All questions were answered. The  patient knows to call the clinic with any problems, questions or concerns. We can certainly see the patient much sooner if necessary. This note was electronically signed.     This document serves as a record of services personally performed by Ancil Linsey, MD. It was created on her behalf by Arlyce Harman,  a trained medical scribe. The creation of this record is based on the scribe's personal observations and the provider's statements to them. This document has been checked and approved by the attending provider.  I have reviewed the above documentation for accuracy and completeness, and I agree with the above. Molli Hazard, MD

## 2014-10-11 ENCOUNTER — Telehealth (HOSPITAL_COMMUNITY): Payer: Self-pay

## 2014-10-11 LAB — CEA: CEA: 91.8 ng/mL — ABNORMAL HIGH (ref 0.0–4.7)

## 2014-10-11 NOTE — Telephone Encounter (Signed)
Call from [facility] stating that Elhadji wanted to know what his labs especially his CEA was from yesterday.

## 2014-10-12 ENCOUNTER — Encounter: Payer: Self-pay | Admitting: Dietician

## 2014-10-12 NOTE — Progress Notes (Signed)
Following up with patient in light of further weight loss  Contacted Pt by Phone   Wt Readings from Last 10 Encounters:  10/10/14 272 lb (123.378 kg)  09/17/14 278 lb (126.1 kg)  09/12/14 276 lb 9 oz (125.448 kg)  08/22/14 274 lb 8 oz (124.512 kg)  08/13/14 280 lb 11.2 oz (127.325 kg)  08/06/14 274 lb 9.6 oz (124.558 kg)  07/23/14 281 lb (127.461 kg)  07/04/14 286 lb (129.729 kg)  06/20/14 283 lb 9.6 oz (128.64 kg)  06/13/14 300 lb (136.079 kg)   Patient weight has decreased by 8 lbs since I last spoke with him.   Called pt 2x. Unable to get in touch. Left message with a callback number if he would like to speak. Will continue to monitor.  Burtis Junes RD, LDN Nutrition Pager: [phone] 10/12/2014 3:07 PM

## 2014-11-22 DEATH — deceased

## 2015-01-14 ENCOUNTER — Ambulatory Visit: Payer: Medicare Other | Admitting: Neurology

## 2015-10-25 IMAGING — CT CT CERVICAL SPINE W/O CM
3 of 6 series · 11 of 33 positions shown, 13 images · non-contrast
Comparison: CT scan of October 14, 2013.

CLINICAL DATA: Posttraumatic headache and neck pain after fall.
Uncertain if there was loss of consciousness.

EXAM:
CT HEAD WITHOUT CONTRAST
CT CERVICAL SPINE WITHOUT CONTRAST
TECHNIQUE: Multidetector CT imaging of the head and cervical spine was
performed following the standard protocol without intravenous
contrast. Multiplanar CT image reconstructions of the cervical spine
were also generated.

[Series 7: sagittal bone 2.0 · sagittal · 0.25mm/px · 5 of 52 slices shown, 6 images]
[im 18/52  bone]
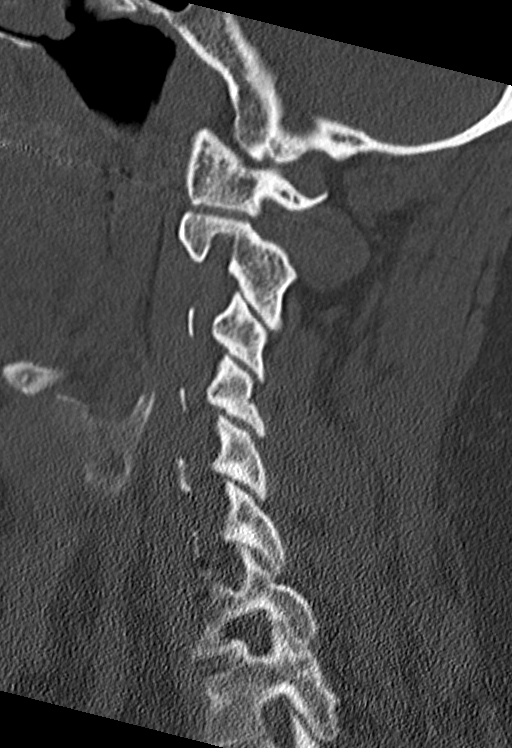
[im 22/52  bone]
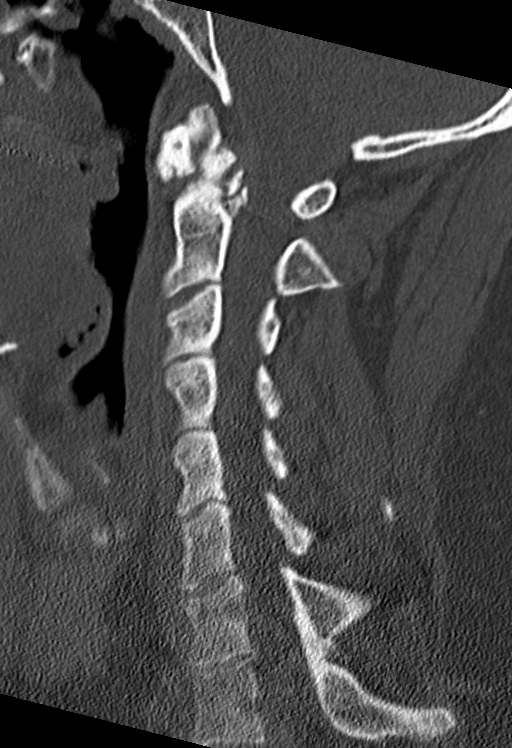
[im 26/52  soft-tissue]
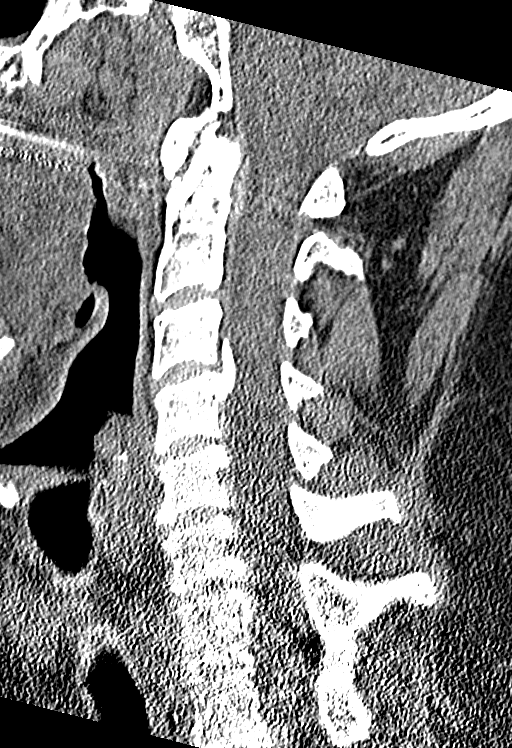
[im 26/52  bone]
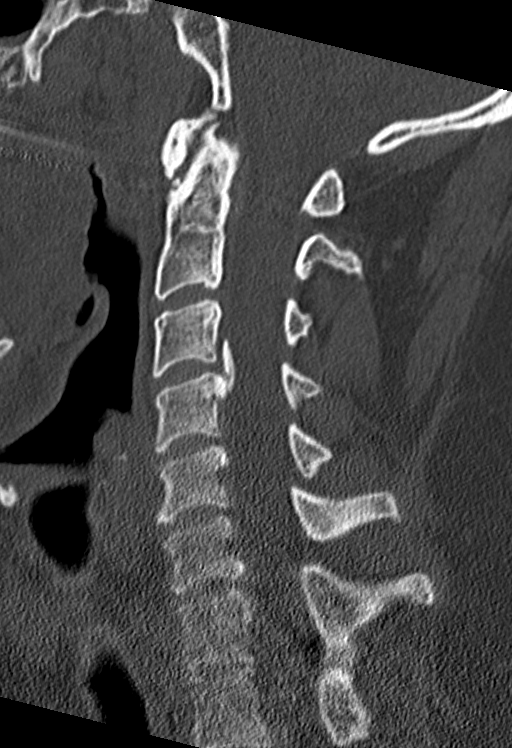
[im 30/52  bone]
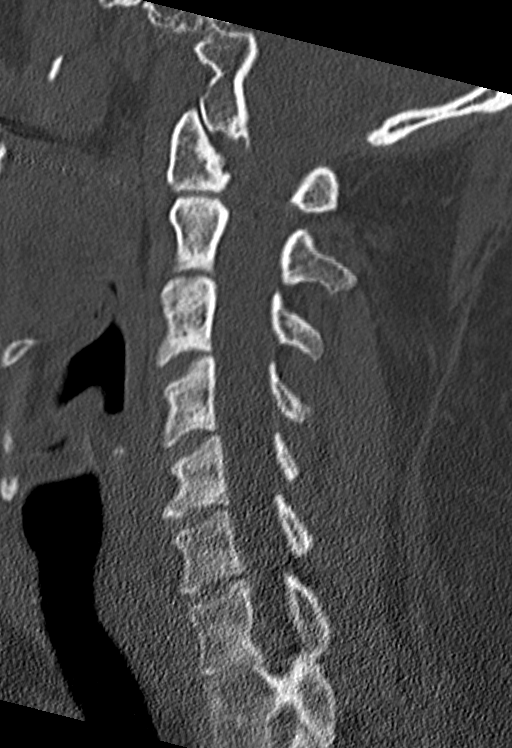
[im 35/52  bone]
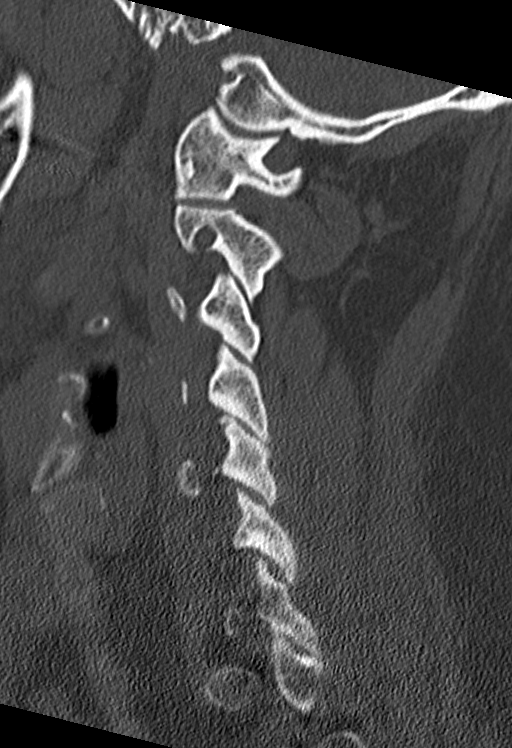

[Series 8: coronal bone 2.0 · coronal · 0.21mm/px · 3 of 53 slices shown]
[im 11/53  bone]
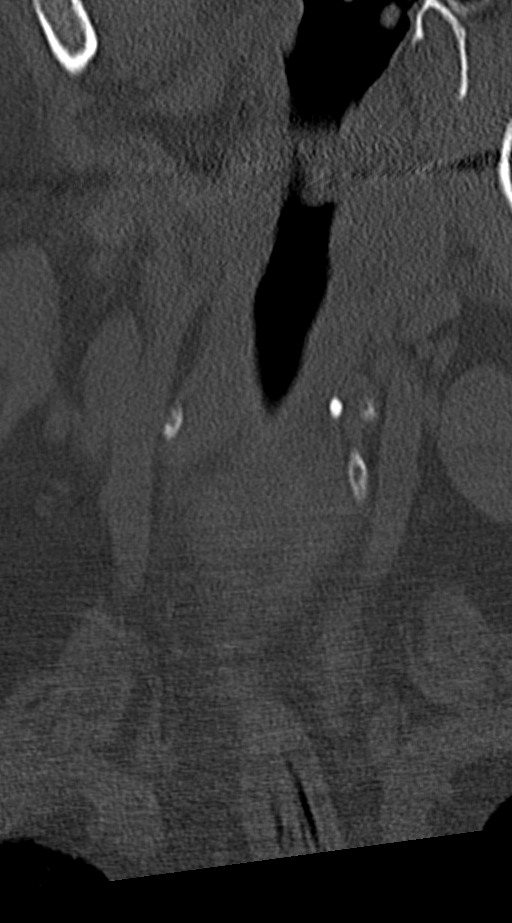
[im 21/53  bone]
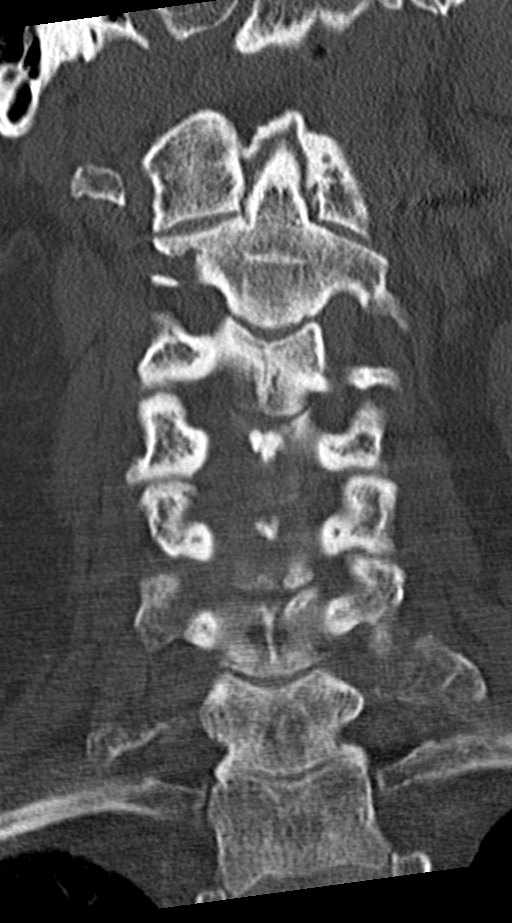
[im 32/53  bone]
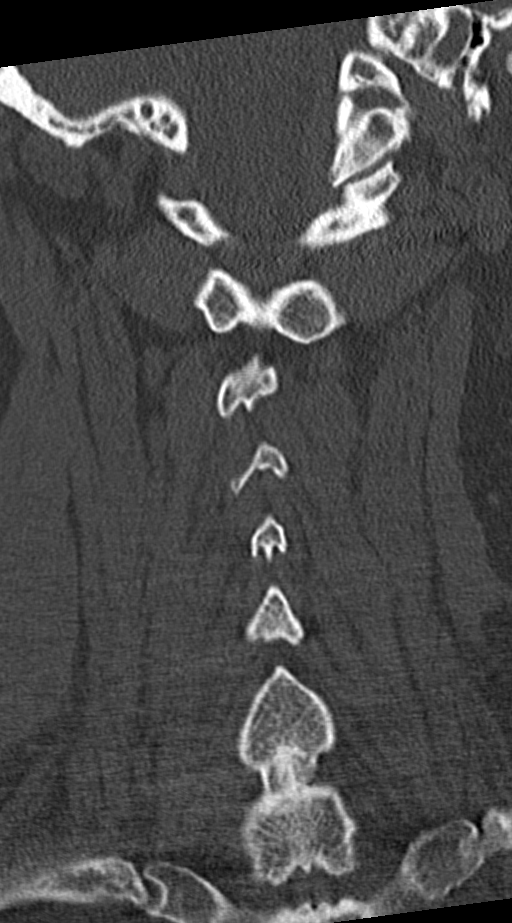

[Series 9: axial bone 2.0 · axial · 0.19mm/px · z∈[+16,+104]mm · 3 of 93 slices shown, 4 images]
[im 24/93  soft-tissue]
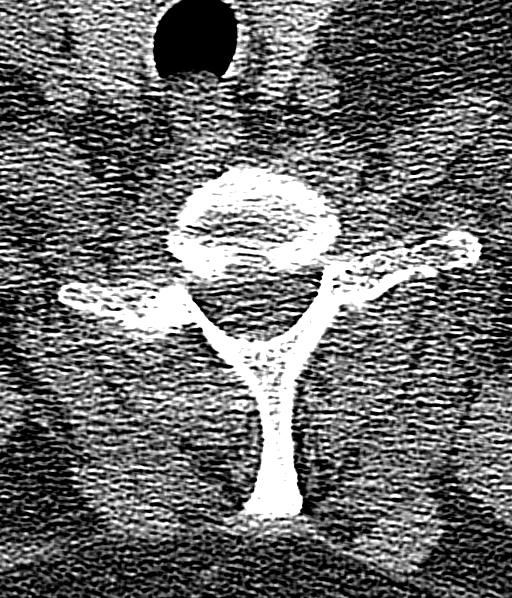
[im 24/93  bone]
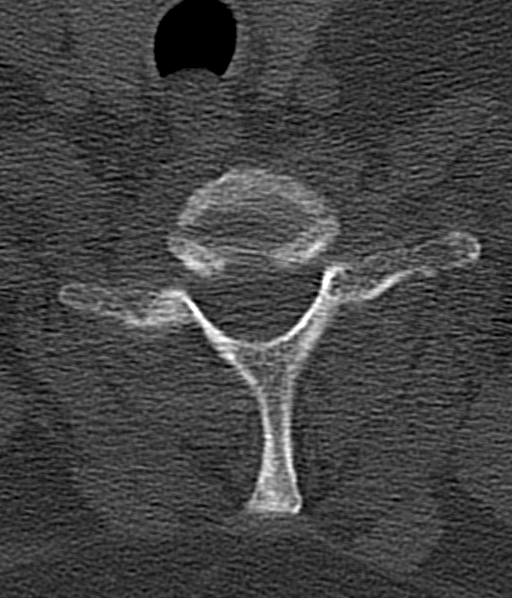
[im 47/93  bone]
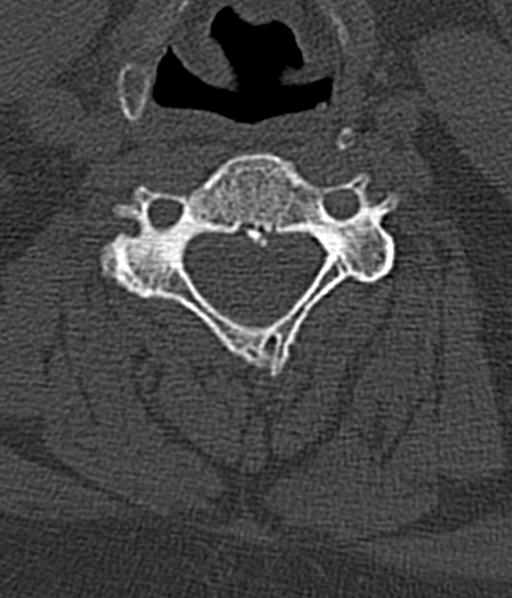
[im 70/93  bone]
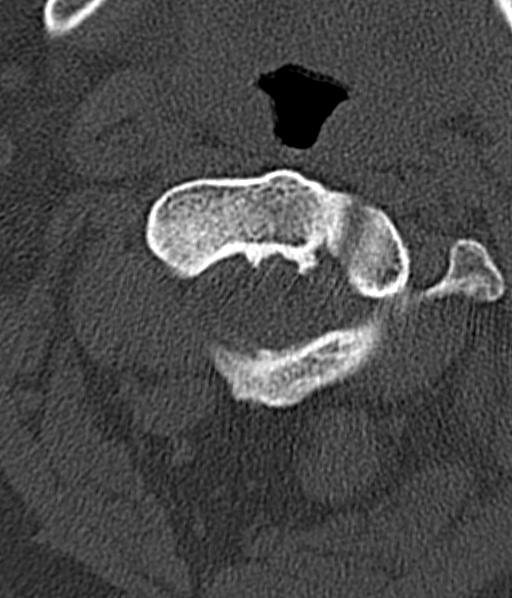

[11 of 33 positions shown; findings below may reference images not displayed]

FINDINGS: CT HEAD FINDINGS

Bony calvarium appears intact. No mass effect or midline shift is
noted. Ventricular size is within normal limits. There is no
evidence of mass lesion, hemorrhage or acute infarction.

CT CERVICAL SPINE FINDINGS

No fracture or spondylolisthesis is noted. Disc spaces appear well
maintained. Posterior facet joints appear normal. Degenerative
changes seen involving the predental space of C1-2. Posterior
osteophyte is noted at C3-4.
IMPRESSION: Normal head CT.

Mild degenerative changes are noted. No significant abnormality seen
in the cervical spine.

## 2020-08-09 ENCOUNTER — Other Ambulatory Visit (HOSPITAL_BASED_OUTPATIENT_CLINIC_OR_DEPARTMENT_OTHER): Payer: Self-pay
